# Patient Record
Sex: Female | Born: 1950 | Race: White | Hispanic: No | Marital: Married | Smoking: Never smoker
Health system: Southern US, Community
[De-identification: ages and names within clinical notes are randomized; demographics above are authoritative.]

## PROBLEM LIST (undated history)

## (undated) DIAGNOSIS — I10 Essential (primary) hypertension: Secondary | ICD-10-CM

## (undated) DIAGNOSIS — E7800 Pure hypercholesterolemia, unspecified: Secondary | ICD-10-CM

## (undated) HISTORY — PX: TONSILLECTOMY: SUR1361

## (undated) HISTORY — PX: ADENOIDECTOMY: SUR15

## (undated) HISTORY — PX: CHOLECYSTECTOMY: SHX55

## (undated) HISTORY — PX: APPENDECTOMY: SHX54

---

## 2015-07-30 ENCOUNTER — Ambulatory Visit (INDEPENDENT_AMBULATORY_CARE_PROVIDER_SITE_OTHER): Payer: No Typology Code available for payment source | Admitting: Family Medicine

## 2015-07-30 ENCOUNTER — Encounter: Payer: Self-pay | Admitting: Family Medicine

## 2015-07-30 ENCOUNTER — Encounter (INDEPENDENT_AMBULATORY_CARE_PROVIDER_SITE_OTHER): Payer: Self-pay

## 2015-07-30 VITALS — BP 138/88 | HR 81 | Ht 62.0 in | Wt 116.0 lb

## 2015-07-30 DIAGNOSIS — S6991XA Unspecified injury of right wrist, hand and finger(s), initial encounter: Secondary | ICD-10-CM | POA: Diagnosis not present

## 2015-07-30 DIAGNOSIS — I10 Essential (primary) hypertension: Secondary | ICD-10-CM

## 2015-07-30 DIAGNOSIS — E785 Hyperlipidemia, unspecified: Secondary | ICD-10-CM | POA: Diagnosis not present

## 2015-07-30 DIAGNOSIS — M8580 Other specified disorders of bone density and structure, unspecified site: Secondary | ICD-10-CM | POA: Diagnosis not present

## 2015-07-30 NOTE — Patient Instructions (Signed)
Your exam currently is consistent with an extensor tendon strain (partial tear also possible) of extensor tendon of your right middle finger. I would avoid splint at this point as you've completed 6 weeks of this. Start occupational therapy - discuss at work or with your neighbor and do this daily. Icing or heat as needed 15 minutes at a time (whichever feels better). Ibuprofen or aleve as needed for pain. Would consider an MRI if not improving as expected at follow-up in 6 weeks.

## 2015-07-31 DIAGNOSIS — S6991XA Unspecified injury of right wrist, hand and finger(s), initial encounter: Secondary | ICD-10-CM | POA: Insufficient documentation

## 2015-07-31 DIAGNOSIS — M8580 Other specified disorders of bone density and structure, unspecified site: Secondary | ICD-10-CM | POA: Insufficient documentation

## 2015-07-31 DIAGNOSIS — E785 Hyperlipidemia, unspecified: Secondary | ICD-10-CM | POA: Insufficient documentation

## 2015-07-31 DIAGNOSIS — I10 Essential (primary) hypertension: Secondary | ICD-10-CM | POA: Insufficient documentation

## 2015-07-31 NOTE — Assessment & Plan Note (Signed)
Initial radiographs negative - I independently reviewed these as well.  She is about 9 weeks out from the initial injury.  No malrotation or angulation and the ultrasound is reassuring she does not have a nonunion (would expect other fractures to have healed by this point).  No boutonniere deformity, ultrasound shows extensor tendon without complete tear, and has already done 6 weeks of extension splinting.  Based on location of pain, exam I suspect this is an extensor tendon strain only.  Advised we go ahead with occupational therapy, icing/heat, nsaids.  Consider MRI if still not improving as expected after 6 weeks.

## 2015-07-31 NOTE — Progress Notes (Signed)
PCP: No primary care provider on file.  Subjective:   HPI: Patient is a 64 y.o. female here for right 3rd digit injury.  Patient reports she was using a drill on sheet metal when the bit was caught and caused torque to her hand on 9/3. Drill then fell directly on her 3rd digit on radial aspect. + swelling, bruising, difficulty moving this especially about PIP area. Pain has persisted at 4/10 level. Radiographs were negative for fracture. Pain sharp. Used an extension splint for at least 6 weeks. Finger gets cold. No skin changes, fever, other complaints.  No past medical history on file.  No current outpatient prescriptions on file prior to visit.   No current facility-administered medications on file prior to visit.    No past surgical history on file.  No Known Allergies  Social History   Social History  . Marital Status: Married    Spouse Name: N/A  . Number of Children: N/A  . Years of Education: N/A   Occupational History  . Not on file.   Social History Main Topics  . Smoking status: Never Smoker   . Smokeless tobacco: Not on file  . Alcohol Use: Not on file  . Drug Use: Not on file  . Sexual Activity: Not on file   Other Topics Concern  . Not on file   Social History Narrative  . No narrative on file    No family history on file.  BP 138/88 mmHg  Pulse 81  Ht 5\' 2"  (1.575 m)  Wt 116 lb (52.617 kg)  BMI 21.21 kg/m2  Review of Systems: See HPI above.    Objective:  Physical Exam:  Gen: NAD  Right 3rd digit: Mild swelling throughout digit - greatest around PIP.  No bruising, other deformity.  No malrotation or angulation. TTP circumferentially about 3rd PIP but worst dorsally here.  No other tenderness. Able to resist flexion and extension at DIP, PIP, MCP. Collateral ligaments intact. NVI distally.  Left 3rd digit: FROM without pain or weakness.  MSK u/s right 3rd digit: Extensor tendons appear intact.  No cortical irregularity to  suggest a nonunion fracture.    Assessment & Plan:  1. Right 3rd digit injury - Initial radiographs negative - I independently reviewed these as well.  She is about 9 weeks out from the initial injury.  No malrotation or angulation and the ultrasound is reassuring she does not have a nonunion (would expect other fractures to have healed by this point).  No boutonniere deformity, ultrasound shows extensor tendon without complete tear, and has already done 6 weeks of extension splinting.  Based on location of pain, exam I suspect this is an extensor tendon strain only.  Advised we go ahead with occupational therapy, icing/heat, nsaids.  Consider MRI if still not improving as expected after 6 weeks.

## 2015-09-09 ENCOUNTER — Ambulatory Visit: Payer: No Typology Code available for payment source | Admitting: Family Medicine

## 2015-09-09 ENCOUNTER — Ambulatory Visit (INDEPENDENT_AMBULATORY_CARE_PROVIDER_SITE_OTHER): Payer: No Typology Code available for payment source | Admitting: Family Medicine

## 2015-09-09 ENCOUNTER — Encounter: Payer: Self-pay | Admitting: Family Medicine

## 2015-09-09 VITALS — BP 134/84 | HR 99 | Ht 62.0 in | Wt 119.0 lb

## 2015-09-09 DIAGNOSIS — S6991XA Unspecified injury of right wrist, hand and finger(s), initial encounter: Secondary | ICD-10-CM | POA: Diagnosis not present

## 2015-09-11 ENCOUNTER — Ambulatory Visit
Admission: RE | Admit: 2015-09-11 | Discharge: 2015-09-11 | Disposition: A | Discharge status: Home / Self Care | Payer: No Typology Code available for payment source | Source: Ambulatory Visit | Attending: Family Medicine | Admitting: Family Medicine

## 2015-09-11 DIAGNOSIS — S6991XA Unspecified injury of right wrist, hand and finger(s), initial encounter: Secondary | ICD-10-CM

## 2015-09-16 NOTE — Assessment & Plan Note (Signed)
Right 3rd digit injury - Initial radiographs negative - I independently reviewed these as well.  She has done occupational therapy, home exercises, extension splinting.  MSK u/s reassuring as well.  Suspect scar tissue formation is preventing full motion though a loose body, OCD is possible.  We first saw her 9 weeks out so is possible she had a hairline fracture that fully healed as well and just has post-injury deformity.  Discussed options and will go ahead with MRI to further assess.

## 2015-09-16 NOTE — Progress Notes (Addendum)
PCP: Paulina FusiSCHULTZ,DOUGLAS E, MD  Subjective:   HPI: Patient is a 64 y.o. female here for right 3rd digit injury.  11/10: Patient reports she was using a drill on sheet metal when the bit was caught and caused torque to her hand on 9/3. Drill then fell directly on her 3rd digit on radial aspect. + swelling, bruising, difficulty moving this especially about PIP area. Pain has persisted at 4/10 level. Radiographs were negative for fracture. Pain sharp. Used an extension splint for at least 6 weeks. Finger gets cold. No skin changes, fever, other complaints.  12/21: Patient reports she has improved since last visit. Pain level 0/10 though can be 9/10 if she strikes finger on something, sharp. Feels like it turns blue if it gets cold and achy. Pain is primarily around 3rd PIP joint. Doing occupational therapy. No skin changes, fever. Hard to fully flex or extend this finger.  No past medical history on file.  Current Outpatient Prescriptions on File Prior to Visit  Medication Sig Dispense Refill  . alendronate (FOSAMAX) 70 MG tablet     . lisinopril (PRINIVIL,ZESTRIL) 10 MG tablet     . pravastatin (PRAVACHOL) 20 MG tablet      No current facility-administered medications on file prior to visit.    No past surgical history on file.  No Known Allergies  Social History   Social History  . Marital Status: Married    Spouse Name: N/A  . Number of Children: N/A  . Years of Education: N/A   Occupational History  . Not on file.   Social History Main Topics  . Smoking status: Never Smoker   . Smokeless tobacco: Not on file  . Alcohol Use: Not on file  . Drug Use: Not on file  . Sexual Activity: Not on file   Other Topics Concern  . Not on file   Social History Narrative    No family history on file.  BP 134/84 mmHg  Pulse 99  Ht 5\' 2"  (1.575 m)  Wt 119 lb (53.978 kg)  BMI 21.76 kg/m2  Review of Systems: See HPI above.    Objective:  Physical Exam:  Gen:  NAD  Right 3rd digit: Mild swelling throughout digit - greatest around PIP.  No bruising, other deformity.  No malrotation or angulation. Minimal TTP circumferentially about 3rd PIP.  No other tenderness. Flexion to 70 degrees, extension lacks 5 degrees. Able to resist flexion and extension at DIP, PIP, MCP with 5/5 strength. Collateral ligaments intact. NVI distally.  Left 3rd digit: FROM without pain or weakness.    Assessment & Plan:  1. Right 3rd digit injury - Initial radiographs negative - I independently reviewed these as well.  She has done occupational therapy, home exercises, extension splinting.  MSK u/s reassuring as well.  Suspect scar tissue formation is preventing full motion though a loose body, OCD is possible.  We first saw her 9 weeks out so is possible she had a hairline fracture that fully healed as well and just has post-injury deformity.  Discussed options and will go ahead with MRI to further assess.    Addendum:  MRI reviewed and discussed with patient - no evidence loose body, OCD, ligament or tendon tears.  Consistent with scar tissue - she will continue with occupational therapy, likely be more aggressive with this having normal MRI.

## 2016-03-08 DIAGNOSIS — I10 Essential (primary) hypertension: Secondary | ICD-10-CM | POA: Diagnosis not present

## 2016-03-08 DIAGNOSIS — E559 Vitamin D deficiency, unspecified: Secondary | ICD-10-CM | POA: Diagnosis not present

## 2016-03-08 DIAGNOSIS — E785 Hyperlipidemia, unspecified: Secondary | ICD-10-CM | POA: Diagnosis not present

## 2016-03-08 DIAGNOSIS — M8580 Other specified disorders of bone density and structure, unspecified site: Secondary | ICD-10-CM | POA: Diagnosis not present

## 2016-03-08 DIAGNOSIS — Z6822 Body mass index (BMI) 22.0-22.9, adult: Secondary | ICD-10-CM | POA: Diagnosis not present

## 2016-04-13 DIAGNOSIS — N3289 Other specified disorders of bladder: Secondary | ICD-10-CM | POA: Diagnosis not present

## 2016-04-13 DIAGNOSIS — N390 Urinary tract infection, site not specified: Secondary | ICD-10-CM | POA: Diagnosis not present

## 2016-04-13 DIAGNOSIS — Z6822 Body mass index (BMI) 22.0-22.9, adult: Secondary | ICD-10-CM | POA: Diagnosis not present

## 2016-09-09 DIAGNOSIS — Z1231 Encounter for screening mammogram for malignant neoplasm of breast: Secondary | ICD-10-CM | POA: Diagnosis not present

## 2016-09-09 DIAGNOSIS — I10 Essential (primary) hypertension: Secondary | ICD-10-CM | POA: Diagnosis not present

## 2016-09-09 DIAGNOSIS — Z9181 History of falling: Secondary | ICD-10-CM | POA: Diagnosis not present

## 2016-09-09 DIAGNOSIS — Z1389 Encounter for screening for other disorder: Secondary | ICD-10-CM | POA: Diagnosis not present

## 2016-09-09 DIAGNOSIS — M8589 Other specified disorders of bone density and structure, multiple sites: Secondary | ICD-10-CM | POA: Diagnosis not present

## 2016-09-09 DIAGNOSIS — E785 Hyperlipidemia, unspecified: Secondary | ICD-10-CM | POA: Diagnosis not present

## 2016-09-09 DIAGNOSIS — E559 Vitamin D deficiency, unspecified: Secondary | ICD-10-CM | POA: Diagnosis not present

## 2016-09-20 DIAGNOSIS — Z1231 Encounter for screening mammogram for malignant neoplasm of breast: Secondary | ICD-10-CM | POA: Diagnosis not present

## 2016-09-20 DIAGNOSIS — Z90710 Acquired absence of both cervix and uterus: Secondary | ICD-10-CM | POA: Diagnosis not present

## 2016-09-20 DIAGNOSIS — Z01419 Encounter for gynecological examination (general) (routine) without abnormal findings: Secondary | ICD-10-CM | POA: Diagnosis not present

## 2016-09-28 DIAGNOSIS — Z1231 Encounter for screening mammogram for malignant neoplasm of breast: Secondary | ICD-10-CM | POA: Diagnosis not present

## 2016-10-21 DIAGNOSIS — H524 Presbyopia: Secondary | ICD-10-CM | POA: Diagnosis not present

## 2016-10-21 DIAGNOSIS — H52222 Regular astigmatism, left eye: Secondary | ICD-10-CM | POA: Diagnosis not present

## 2016-10-21 DIAGNOSIS — H25813 Combined forms of age-related cataract, bilateral: Secondary | ICD-10-CM | POA: Diagnosis not present

## 2016-10-21 DIAGNOSIS — H5203 Hypermetropia, bilateral: Secondary | ICD-10-CM | POA: Diagnosis not present

## 2017-01-20 DIAGNOSIS — S8265XA Nondisplaced fracture of lateral malleolus of left fibula, initial encounter for closed fracture: Secondary | ICD-10-CM | POA: Diagnosis not present

## 2017-01-23 DIAGNOSIS — S82402A Unspecified fracture of shaft of left fibula, initial encounter for closed fracture: Secondary | ICD-10-CM | POA: Diagnosis not present

## 2017-02-16 DIAGNOSIS — S82402A Unspecified fracture of shaft of left fibula, initial encounter for closed fracture: Secondary | ICD-10-CM | POA: Diagnosis not present

## 2017-03-23 DIAGNOSIS — I10 Essential (primary) hypertension: Secondary | ICD-10-CM | POA: Diagnosis not present

## 2017-03-23 DIAGNOSIS — E785 Hyperlipidemia, unspecified: Secondary | ICD-10-CM | POA: Diagnosis not present

## 2017-03-23 DIAGNOSIS — Z6822 Body mass index (BMI) 22.0-22.9, adult: Secondary | ICD-10-CM | POA: Diagnosis not present

## 2017-03-23 DIAGNOSIS — M8589 Other specified disorders of bone density and structure, multiple sites: Secondary | ICD-10-CM | POA: Diagnosis not present

## 2017-03-23 DIAGNOSIS — E559 Vitamin D deficiency, unspecified: Secondary | ICD-10-CM | POA: Diagnosis not present

## 2017-03-23 DIAGNOSIS — Z139 Encounter for screening, unspecified: Secondary | ICD-10-CM | POA: Diagnosis not present

## 2017-03-30 DIAGNOSIS — S82402A Unspecified fracture of shaft of left fibula, initial encounter for closed fracture: Secondary | ICD-10-CM | POA: Diagnosis not present

## 2017-05-11 DIAGNOSIS — S82402A Unspecified fracture of shaft of left fibula, initial encounter for closed fracture: Secondary | ICD-10-CM | POA: Diagnosis not present

## 2017-05-18 DIAGNOSIS — Z1389 Encounter for screening for other disorder: Secondary | ICD-10-CM | POA: Diagnosis not present

## 2017-05-18 DIAGNOSIS — E785 Hyperlipidemia, unspecified: Secondary | ICD-10-CM | POA: Diagnosis not present

## 2017-05-18 DIAGNOSIS — Z0000 Encounter for general adult medical examination without abnormal findings: Secondary | ICD-10-CM | POA: Diagnosis not present

## 2017-05-18 DIAGNOSIS — Z139 Encounter for screening, unspecified: Secondary | ICD-10-CM | POA: Diagnosis not present

## 2017-05-18 DIAGNOSIS — Z9181 History of falling: Secondary | ICD-10-CM | POA: Diagnosis not present

## 2017-05-18 DIAGNOSIS — Z136 Encounter for screening for cardiovascular disorders: Secondary | ICD-10-CM | POA: Diagnosis not present

## 2017-09-26 DIAGNOSIS — E785 Hyperlipidemia, unspecified: Secondary | ICD-10-CM | POA: Diagnosis not present

## 2017-09-26 DIAGNOSIS — E559 Vitamin D deficiency, unspecified: Secondary | ICD-10-CM | POA: Diagnosis not present

## 2017-10-18 DIAGNOSIS — M8589 Other specified disorders of bone density and structure, multiple sites: Secondary | ICD-10-CM | POA: Diagnosis not present

## 2017-11-02 DIAGNOSIS — Z1231 Encounter for screening mammogram for malignant neoplasm of breast: Secondary | ICD-10-CM | POA: Diagnosis not present

## 2017-12-11 DIAGNOSIS — H25813 Combined forms of age-related cataract, bilateral: Secondary | ICD-10-CM | POA: Diagnosis not present

## 2017-12-11 DIAGNOSIS — H43811 Vitreous degeneration, right eye: Secondary | ICD-10-CM | POA: Diagnosis not present

## 2018-01-08 DIAGNOSIS — H43811 Vitreous degeneration, right eye: Secondary | ICD-10-CM | POA: Diagnosis not present

## 2018-03-27 DIAGNOSIS — E559 Vitamin D deficiency, unspecified: Secondary | ICD-10-CM | POA: Diagnosis not present

## 2018-03-27 DIAGNOSIS — M8589 Other specified disorders of bone density and structure, multiple sites: Secondary | ICD-10-CM | POA: Diagnosis not present

## 2018-03-27 DIAGNOSIS — E785 Hyperlipidemia, unspecified: Secondary | ICD-10-CM | POA: Diagnosis not present

## 2018-03-27 DIAGNOSIS — Z1339 Encounter for screening examination for other mental health and behavioral disorders: Secondary | ICD-10-CM | POA: Diagnosis not present

## 2018-03-27 DIAGNOSIS — I10 Essential (primary) hypertension: Secondary | ICD-10-CM | POA: Diagnosis not present

## 2018-06-13 DIAGNOSIS — Z139 Encounter for screening, unspecified: Secondary | ICD-10-CM | POA: Diagnosis not present

## 2018-06-13 DIAGNOSIS — Z1331 Encounter for screening for depression: Secondary | ICD-10-CM | POA: Diagnosis not present

## 2018-06-13 DIAGNOSIS — Z136 Encounter for screening for cardiovascular disorders: Secondary | ICD-10-CM | POA: Diagnosis not present

## 2018-06-13 DIAGNOSIS — Z1239 Encounter for other screening for malignant neoplasm of breast: Secondary | ICD-10-CM | POA: Diagnosis not present

## 2018-06-13 DIAGNOSIS — E785 Hyperlipidemia, unspecified: Secondary | ICD-10-CM | POA: Diagnosis not present

## 2018-06-13 DIAGNOSIS — Z9181 History of falling: Secondary | ICD-10-CM | POA: Diagnosis not present

## 2018-06-13 DIAGNOSIS — Z0000 Encounter for general adult medical examination without abnormal findings: Secondary | ICD-10-CM | POA: Diagnosis not present

## 2018-10-02 DIAGNOSIS — Z2821 Immunization not carried out because of patient refusal: Secondary | ICD-10-CM | POA: Diagnosis not present

## 2018-10-02 DIAGNOSIS — I10 Essential (primary) hypertension: Secondary | ICD-10-CM | POA: Diagnosis not present

## 2018-10-02 DIAGNOSIS — E559 Vitamin D deficiency, unspecified: Secondary | ICD-10-CM | POA: Diagnosis not present

## 2018-10-02 DIAGNOSIS — E785 Hyperlipidemia, unspecified: Secondary | ICD-10-CM | POA: Diagnosis not present

## 2018-10-02 DIAGNOSIS — Z23 Encounter for immunization: Secondary | ICD-10-CM | POA: Diagnosis not present

## 2018-10-02 DIAGNOSIS — M8589 Other specified disorders of bone density and structure, multiple sites: Secondary | ICD-10-CM | POA: Diagnosis not present

## 2018-10-02 DIAGNOSIS — Z1231 Encounter for screening mammogram for malignant neoplasm of breast: Secondary | ICD-10-CM | POA: Diagnosis not present

## 2018-11-13 DIAGNOSIS — Z1231 Encounter for screening mammogram for malignant neoplasm of breast: Secondary | ICD-10-CM | POA: Diagnosis not present

## 2018-12-21 DIAGNOSIS — L237 Allergic contact dermatitis due to plants, except food: Secondary | ICD-10-CM | POA: Diagnosis not present

## 2018-12-21 DIAGNOSIS — R748 Abnormal levels of other serum enzymes: Secondary | ICD-10-CM | POA: Diagnosis not present

## 2019-02-18 ENCOUNTER — Encounter: Payer: Self-pay | Admitting: Gastroenterology

## 2019-03-08 DIAGNOSIS — H04123 Dry eye syndrome of bilateral lacrimal glands: Secondary | ICD-10-CM | POA: Diagnosis not present

## 2019-04-08 DIAGNOSIS — E559 Vitamin D deficiency, unspecified: Secondary | ICD-10-CM | POA: Diagnosis not present

## 2019-04-08 DIAGNOSIS — M8589 Other specified disorders of bone density and structure, multiple sites: Secondary | ICD-10-CM | POA: Diagnosis not present

## 2019-04-08 DIAGNOSIS — K219 Gastro-esophageal reflux disease without esophagitis: Secondary | ICD-10-CM | POA: Diagnosis not present

## 2019-04-08 DIAGNOSIS — I10 Essential (primary) hypertension: Secondary | ICD-10-CM | POA: Diagnosis not present

## 2019-04-08 DIAGNOSIS — E785 Hyperlipidemia, unspecified: Secondary | ICD-10-CM | POA: Diagnosis not present

## 2019-06-18 DIAGNOSIS — Z9181 History of falling: Secondary | ICD-10-CM | POA: Diagnosis not present

## 2019-06-18 DIAGNOSIS — Z139 Encounter for screening, unspecified: Secondary | ICD-10-CM | POA: Diagnosis not present

## 2019-06-18 DIAGNOSIS — E785 Hyperlipidemia, unspecified: Secondary | ICD-10-CM | POA: Diagnosis not present

## 2019-06-18 DIAGNOSIS — Z1331 Encounter for screening for depression: Secondary | ICD-10-CM | POA: Diagnosis not present

## 2019-06-18 DIAGNOSIS — Z1211 Encounter for screening for malignant neoplasm of colon: Secondary | ICD-10-CM | POA: Diagnosis not present

## 2019-06-18 DIAGNOSIS — Z0000 Encounter for general adult medical examination without abnormal findings: Secondary | ICD-10-CM | POA: Diagnosis not present

## 2019-10-08 DIAGNOSIS — K219 Gastro-esophageal reflux disease without esophagitis: Secondary | ICD-10-CM | POA: Diagnosis not present

## 2019-10-08 DIAGNOSIS — E559 Vitamin D deficiency, unspecified: Secondary | ICD-10-CM | POA: Diagnosis not present

## 2019-10-08 DIAGNOSIS — E785 Hyperlipidemia, unspecified: Secondary | ICD-10-CM | POA: Diagnosis not present

## 2019-10-08 DIAGNOSIS — Z1231 Encounter for screening mammogram for malignant neoplasm of breast: Secondary | ICD-10-CM | POA: Diagnosis not present

## 2019-10-08 DIAGNOSIS — M8589 Other specified disorders of bone density and structure, multiple sites: Secondary | ICD-10-CM | POA: Diagnosis not present

## 2019-10-08 DIAGNOSIS — I10 Essential (primary) hypertension: Secondary | ICD-10-CM | POA: Diagnosis not present

## 2019-11-22 DIAGNOSIS — M8589 Other specified disorders of bone density and structure, multiple sites: Secondary | ICD-10-CM | POA: Diagnosis not present

## 2019-12-12 DIAGNOSIS — Z1231 Encounter for screening mammogram for malignant neoplasm of breast: Secondary | ICD-10-CM | POA: Diagnosis not present

## 2019-12-12 DIAGNOSIS — M8589 Other specified disorders of bone density and structure, multiple sites: Secondary | ICD-10-CM | POA: Diagnosis not present

## 2019-12-31 DIAGNOSIS — R319 Hematuria, unspecified: Secondary | ICD-10-CM | POA: Diagnosis not present

## 2020-01-08 DIAGNOSIS — R319 Hematuria, unspecified: Secondary | ICD-10-CM | POA: Diagnosis not present

## 2020-01-29 ENCOUNTER — Emergency Department (HOSPITAL_BASED_OUTPATIENT_CLINIC_OR_DEPARTMENT_OTHER): Payer: PPO

## 2020-01-29 ENCOUNTER — Emergency Department (HOSPITAL_BASED_OUTPATIENT_CLINIC_OR_DEPARTMENT_OTHER)
Admission: EM | Admit: 2020-01-29 | Discharge: 2020-01-29 | Disposition: A | Discharge status: Home / Self Care | Payer: PPO | Attending: Emergency Medicine | Admitting: Emergency Medicine

## 2020-01-29 ENCOUNTER — Other Ambulatory Visit: Payer: Self-pay

## 2020-01-29 ENCOUNTER — Encounter (HOSPITAL_BASED_OUTPATIENT_CLINIC_OR_DEPARTMENT_OTHER): Payer: Self-pay | Admitting: Emergency Medicine

## 2020-01-29 DIAGNOSIS — R55 Syncope and collapse: Secondary | ICD-10-CM | POA: Diagnosis not present

## 2020-01-29 DIAGNOSIS — R42 Dizziness and giddiness: Secondary | ICD-10-CM | POA: Insufficient documentation

## 2020-01-29 DIAGNOSIS — R0789 Other chest pain: Secondary | ICD-10-CM | POA: Insufficient documentation

## 2020-01-29 DIAGNOSIS — I10 Essential (primary) hypertension: Secondary | ICD-10-CM | POA: Insufficient documentation

## 2020-01-29 DIAGNOSIS — Z79899 Other long term (current) drug therapy: Secondary | ICD-10-CM | POA: Diagnosis not present

## 2020-01-29 DIAGNOSIS — E782 Mixed hyperlipidemia: Secondary | ICD-10-CM | POA: Diagnosis not present

## 2020-01-29 DIAGNOSIS — R079 Chest pain, unspecified: Secondary | ICD-10-CM

## 2020-01-29 DIAGNOSIS — R519 Headache, unspecified: Secondary | ICD-10-CM | POA: Diagnosis not present

## 2020-01-29 HISTORY — DX: Essential (primary) hypertension: I10

## 2020-01-29 HISTORY — DX: Pure hypercholesterolemia, unspecified: E78.00

## 2020-01-29 LAB — CBC WITH DIFFERENTIAL/PLATELET
Abs Immature Granulocytes: 0.04 10*3/uL (ref 0.00–0.07)
Basophils Absolute: 0 10*3/uL (ref 0.0–0.1)
Basophils Relative: 0 %
Eosinophils Absolute: 0 10*3/uL (ref 0.0–0.5)
Eosinophils Relative: 0 %
HCT: 43.1 % (ref 36.0–46.0)
Hemoglobin: 14.2 g/dL (ref 12.0–15.0)
Immature Granulocytes: 0 %
Lymphocytes Relative: 11 %
Lymphs Abs: 1.3 10*3/uL (ref 0.7–4.0)
MCH: 29.6 pg (ref 26.0–34.0)
MCHC: 32.9 g/dL (ref 30.0–36.0)
MCV: 89.8 fL (ref 80.0–100.0)
Monocytes Absolute: 0.6 10*3/uL (ref 0.1–1.0)
Monocytes Relative: 5 %
Neutro Abs: 9.4 10*3/uL — ABNORMAL HIGH (ref 1.7–7.7)
Neutrophils Relative %: 84 %
Platelets: 253 10*3/uL (ref 150–400)
RBC: 4.8 MIL/uL (ref 3.87–5.11)
RDW: 13.4 % (ref 11.5–15.5)
WBC: 11.4 10*3/uL — ABNORMAL HIGH (ref 4.0–10.5)
nRBC: 0 % (ref 0.0–0.2)

## 2020-01-29 LAB — COMPREHENSIVE METABOLIC PANEL
ALT: 16 U/L (ref 0–44)
AST: 19 U/L (ref 15–41)
Albumin: 4.1 g/dL (ref 3.5–5.0)
Alkaline Phosphatase: 80 U/L (ref 38–126)
Anion gap: 8 (ref 5–15)
BUN: 26 mg/dL — ABNORMAL HIGH (ref 8–23)
CO2: 25 mmol/L (ref 22–32)
Calcium: 9.8 mg/dL (ref 8.9–10.3)
Chloride: 107 mmol/L (ref 98–111)
Creatinine, Ser: 0.62 mg/dL (ref 0.44–1.00)
GFR calc Af Amer: >60 mL/min (ref >60)
GFR calc non Af Amer: >60 mL/min (ref >60)
Glucose, Bld: 127 mg/dL — ABNORMAL HIGH (ref 70–99)
Potassium: 4 mmol/L (ref 3.5–5.1)
Sodium: 140 mmol/L (ref 135–145)
Total Bilirubin: 0.4 mg/dL (ref 0.3–1.2)
Total Protein: 7.2 g/dL (ref 6.5–8.1)

## 2020-01-29 LAB — TROPONIN I (HIGH SENSITIVITY)
Troponin I (High Sensitivity): 3 ng/L (ref <18)
Troponin I (High Sensitivity): 3 ng/L (ref <18)

## 2020-01-29 MED ORDER — IOHEXOL 350 MG/ML SOLN
100.0000 mL | Freq: Once | INTRAVENOUS | Status: AC | PRN
  Administered 2020-01-29: 100 mL via INTRAVENOUS

## 2020-01-29 NOTE — ED Triage Notes (Signed)
Pt arrives with c/o syncope while cleaning a wound on family member's hand. Began to feel nauseated and passed out. Family states she hit her head.

## 2020-01-29 NOTE — ED Notes (Signed)
Pt returned from CT °

## 2020-01-29 NOTE — ED Provider Notes (Signed)
[facility] DEPART[facility] Provider Note   CSN: [id number] Arrival date & time: 01/29/20  1124     History Chief Complaint  Patient presents with  . Loss of Consciousness    Nichole Stephens is a 69 y.o. female.  HPI     Finished cleaning a wound, then felt nauseas,lightheaded and had episode of syncope Hit head on the wall EMS came and initially refused, then called PCP and came for evaluation Not on blood thinners, 81mg  ASA Probably a few seconds out Slight headache 3/10 Pain right chest, worse with movement and deep breath Feels tired [address] [place] on right side per family  No other recent illness, no chest pains/sob/headache prior to hitting head. Eating and drinking ok, no black or bloody stools. Did have hematuria checked out and it was better  Passed out before in restaurant, saw Cardiologist had ECHO, holter monitor, 20 yr ago  Past Medical History:  Diagnosis Date  . Hypercholesteremia   . Hypertension     Patient Active Problem List   Diagnosis Date Noted  . Essential hypertension, benign 07/31/2015  . Hyperlipidemia 07/31/2015  . Osteopenia 07/31/2015  . Injury of right middle finger 07/31/2015    Past Surgical History:  Procedure Laterality Date  . ADENOIDECTOMY    . APPENDECTOMY    . CHOLECYSTECTOMY    . TONSILLECTOMY       OB History   No obstetric history on file.     No family history on file.  Social History   Tobacco Use  . Smoking status: Never Smoker  . Smokeless tobacco: Never Used  Substance Use Topics  . Alcohol use: Not Currently    Alcohol/week: 0.0 standard drinks  . Drug use: Not Currently    Home Medications Prior to Admission medications   Medication Sig Start Date End Date Taking? Authorizing Provider  alendronate (FOSAMAX) 70 MG tablet  07/27/15  Yes [provider]  lisinopril (PRINIVIL,ZESTRIL) 10 MG tablet  06/18/15   [provider]  pravastatin (PRAVACHOL) 20 MG tablet  05/27/15    [provider]    Allergies    Patient has no known allergies.  Review of Systems   Review of Systems  Constitutional: Negative for fever.  HENT: Negative for congestion.   Eyes: Negative for visual disturbance.  Respiratory: Negative for cough and shortness of breath.   Cardiovascular: Positive for chest pain (right sided).  Gastrointestinal: Negative for abdominal pain, nausea and vomiting.  Musculoskeletal: Positive for back pain.  Neurological: Positive for syncope, light-headedness and headaches. Negative for dizziness, facial asymmetry, speech difficulty, weakness and numbness.    Physical Exam Updated Vital Signs BP (!) 168/74 (BP Location: Left Arm)   Pulse 68   Temp 97.8 F (36.6 C) (Oral)   Resp 12   Ht 5\' 2"  (1.575 m)   Wt 49.9 kg   SpO2 99%   BMI 20.12 kg/m   Physical Exam Vitals and nursing note reviewed.  Constitutional:      General: She is not in acute distress.    Appearance: Normal appearance. She is well-developed. She is not ill-appearing or diaphoretic.  HENT:     Head: Normocephalic and atraumatic.  Eyes:     General: No visual field deficit.    Extraocular Movements: Extraocular movements intact.     Conjunctiva/sclera: Conjunctivae normal.     Pupils: Pupils are equal, round, and reactive to light.  Cardiovascular:     Rate and Rhythm: Normal rate and regular rhythm.  Pulses: Normal pulses.     Heart sounds: Normal heart sounds. No murmur. No friction rub. No gallop.      Comments: Mild chest wall and thoracic back tenderness  Pulmonary:     Effort: Pulmonary effort is normal. No respiratory distress.     Breath sounds: Normal breath sounds. No wheezing or rales.  Abdominal:     General: There is no distension.     Palpations: Abdomen is soft.     Tenderness: There is no abdominal tenderness. There is no guarding.  Musculoskeletal:        General: No swelling or tenderness.     Cervical back: Normal range of motion. No  rigidity or tenderness.  Skin:    General: Skin is warm and dry.     Findings: No erythema or rash.  Neurological:     General: No focal deficit present.     Mental Status: She is alert and oriented to person, place, and time.     GCS: GCS eye subscore is 4. GCS verbal subscore is 5. GCS motor subscore is 6.     Cranial Nerves: No cranial nerve deficit, dysarthria or facial asymmetry.     Sensory: No sensory deficit.     Motor: No weakness or tremor.     Coordination: Coordination normal. Finger-Nose-Finger Test normal.     Gait: Gait normal.     ED Results / Procedures / Treatments   Labs (all labs ordered are listed, but only abnormal results are displayed) Labs Reviewed  CBC WITH DIFFERENTIAL/PLATELET - Abnormal; Notable for the following components:      Result Value   WBC 11.4 (*)    Neutro Abs 9.4 (*)    All other components within normal limits  COMPREHENSIVE METABOLIC PANEL - Abnormal; Notable for the following components:   Glucose, Bld 127 (*)    BUN 26 (*)    All other components within normal limits  TROPONIN I (HIGH SENSITIVITY)  TROPONIN I (HIGH SENSITIVITY)    EKG EKG Interpretation  Date/Time:  Wednesday Jan 29 2020 11:34:53 EDT Ventricular Rate:  71 PR Interval:    QRS Duration: 89 QT Interval:  409 QTC Calculation: 445 R Axis:   50 Text Interpretation: Sinus rhythm Baseline wander in lead(s) III V6 No previous ECGs available Confirmed by Alvira Monday ([id number]) on 01/29/2020 11:52:00 AM   Radiology CT Head Wo Contrast  Result Date: 01/29/2020 CLINICAL DATA:  Head trauma, headache, struck head on LEFT side, has a knot, syncope, hypertension EXAM: CT HEAD WITHOUT CONTRAST TECHNIQUE: Contiguous axial images were obtained from the base of the skull through the vertex without intravenous contrast. Sagittal and coronal MPR images reconstructed from axial data set. COMPARISON:  None FINDINGS: Brain: Generalized atrophy. Normal ventricular morphology. No  midline shift or mass effect. Small vessel chronic ischemic changes of deep cerebral white matter. No intracranial hemorrhage, mass lesion, evidence of acute infarction, or extra-axial fluid collection. Vascular: No hyperdense vessels Skull: Intact. Small RIGHT parietal scalp hematoma. Sinuses/Orbits: Minimal fluid LEFT sphenoid sinus. Remaining paranasal sinuses and mastoid air cells clear Other: N/A IMPRESSION: Atrophy with small vessel chronic ischemic changes of deep cerebral white matter. No acute intracranial abnormalities. Small RIGHT parietal scalp hematoma. Electronically Signed   By: Ulyses Southward M.D.   On: 01/29/2020 14:18   CT Angio Chest PE W and/or Wo Contrast  Result Date: 01/29/2020 CLINICAL DATA:  Pleuritic chest pain, hypertension EXAM: CT ANGIOGRAPHY CHEST WITH CONTRAST TECHNIQUE: Multidetector CT imaging of  the chest was performed using the standard protocol during bolus administration of intravenous contrast. Multiplanar CT image reconstructions and MIPs were obtained to evaluate the vascular anatomy. CONTRAST:  OMNIPAQUE IOHEXOL 350 MG/ML SOLN COMPARISON:  X-ray 04/13/2006 FINDINGS: Cardiovascular: Satisfactory opacification of the pulmonary arteries to the segmental level. No evidence of pulmonary embolism. Normal heart size. No pericardial effusion. Thoracic aorta is nonaneurysmal. Four vessel arch, an anatomic variant. Scattered atherosclerotic calcification of the thoracic aorta. Mediastinum/Nodes: No enlarged mediastinal, hilar, or axillary lymph nodes. Thyroid gland, trachea, and esophagus demonstrate no significant findings. Lungs/Pleura: Mild bibasilar subsegmental atelectasis. No focal airspace consolidation. No pleural effusion or pneumothorax. Upper Abdomen: No acute abnormality. 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 This finding was present on prior chest radiograph 03/24/2006. There are remote  healed posterior left 4, 5, and sixth rib fractures. No acute osseous abnormality. Review of the MIP images confirms the above findings. IMPRESSION: 1. Negative for pulmonary embolism. 2. Lungs are clear. 3. Aortic atherosclerosis.  (ICD10-I70.0). Electronically Signed   By: Duanne Guess D.O.   On: 01/29/2020 14:20    Procedures Procedures (including critical care time)  Medications Ordered in ED Medications  iohexol (OMNIPAQUE) 350 MG/ML injection 100 mL (100 mLs Intravenous Contrast Given 01/29/20 1356)    ED Course  I have reviewed the triage vital signs and the nursing notes.  Pertinent labs & imaging results that were available during my care of the patient were reviewed by me and considered in my medical decision making (see chart for details).    MDM Rules/Calculators/A&P                      Very pleasant 69yo female with history of htn, hypercholesterolemia, episode of prior syncope many years ago, presents with concern for syncope after cleaning a wound.  Reports pleuritic pain, PE study done given hx of pain and syncope, shows no evidence of PE.  Low suspicion for dissection, normal pulses bilateral upper and lower extremities.  Hit head, no sign of ICH on CT.  No other significant abnormality on labwork.  Given situation, preceding nausea, lightheadedness, feel most likely vasovagal incident and msk CP. Recommend follow up with PCP.  Patient discharged in stable condition with understanding of reasons to return.     Final Clinical Impression(s) / ED Diagnoses Final diagnoses:  Vasovagal syncope  Right-sided chest pain    Rx / DC Orders ED Discharge Orders    None       Alvira Monday, MD 01/29/20 2236

## 2020-01-29 NOTE — ED Notes (Signed)
PT on monitor 

## 2020-01-29 NOTE — ED Notes (Signed)
Ambulated to bathroom with slow steady gait.

## 2020-01-29 NOTE — ED Notes (Signed)
ED Provider at bedside. 

## 2020-02-04 DIAGNOSIS — I10 Essential (primary) hypertension: Secondary | ICD-10-CM | POA: Diagnosis not present

## 2020-02-18 DIAGNOSIS — I10 Essential (primary) hypertension: Secondary | ICD-10-CM | POA: Diagnosis not present

## 2020-02-18 DIAGNOSIS — M25561 Pain in right knee: Secondary | ICD-10-CM | POA: Diagnosis not present

## 2020-03-13 DIAGNOSIS — H524 Presbyopia: Secondary | ICD-10-CM | POA: Diagnosis not present

## 2020-03-18 DIAGNOSIS — M25562 Pain in left knee: Secondary | ICD-10-CM | POA: Diagnosis not present

## 2020-03-18 DIAGNOSIS — M25462 Effusion, left knee: Secondary | ICD-10-CM | POA: Diagnosis not present

## 2020-03-19 DIAGNOSIS — I10 Essential (primary) hypertension: Secondary | ICD-10-CM | POA: Diagnosis not present

## 2020-03-31 DIAGNOSIS — M23221 Derangement of posterior horn of medial meniscus due to old tear or injury, right knee: Secondary | ICD-10-CM | POA: Diagnosis not present

## 2020-03-31 DIAGNOSIS — M25561 Pain in right knee: Secondary | ICD-10-CM | POA: Diagnosis not present

## 2020-03-31 DIAGNOSIS — X58XXXA Exposure to other specified factors, initial encounter: Secondary | ICD-10-CM | POA: Diagnosis not present

## 2020-03-31 DIAGNOSIS — M1711 Unilateral primary osteoarthritis, right knee: Secondary | ICD-10-CM | POA: Diagnosis not present

## 2020-03-31 DIAGNOSIS — M7121 Synovial cyst of popliteal space [Baker], right knee: Secondary | ICD-10-CM | POA: Diagnosis not present

## 2020-03-31 DIAGNOSIS — M25461 Effusion, right knee: Secondary | ICD-10-CM | POA: Diagnosis not present

## 2020-03-31 DIAGNOSIS — S83241A Other tear of medial meniscus, current injury, right knee, initial encounter: Secondary | ICD-10-CM | POA: Diagnosis not present

## 2020-03-31 DIAGNOSIS — M84451A Pathological fracture, right femur, initial encounter for fracture: Secondary | ICD-10-CM | POA: Diagnosis not present

## 2020-04-06 DIAGNOSIS — M25461 Effusion, right knee: Secondary | ICD-10-CM | POA: Diagnosis not present

## 2020-04-06 DIAGNOSIS — M1711 Unilateral primary osteoarthritis, right knee: Secondary | ICD-10-CM | POA: Diagnosis not present

## 2020-04-06 DIAGNOSIS — M25561 Pain in right knee: Secondary | ICD-10-CM | POA: Diagnosis not present

## 2020-04-06 DIAGNOSIS — M23321 Other meniscus derangements, posterior horn of medial meniscus, right knee: Secondary | ICD-10-CM | POA: Diagnosis not present

## 2020-04-14 ENCOUNTER — Other Ambulatory Visit: Payer: Self-pay

## 2020-04-15 DIAGNOSIS — I10 Essential (primary) hypertension: Secondary | ICD-10-CM | POA: Diagnosis not present

## 2020-04-15 DIAGNOSIS — K219 Gastro-esophageal reflux disease without esophagitis: Secondary | ICD-10-CM | POA: Diagnosis not present

## 2020-04-15 DIAGNOSIS — M8589 Other specified disorders of bone density and structure, multiple sites: Secondary | ICD-10-CM | POA: Diagnosis not present

## 2020-04-15 DIAGNOSIS — E785 Hyperlipidemia, unspecified: Secondary | ICD-10-CM | POA: Diagnosis not present

## 2020-04-15 DIAGNOSIS — E559 Vitamin D deficiency, unspecified: Secondary | ICD-10-CM | POA: Diagnosis not present

## 2020-04-16 DIAGNOSIS — Z1159 Encounter for screening for other viral diseases: Secondary | ICD-10-CM | POA: Diagnosis not present

## 2020-04-16 DIAGNOSIS — Z1152 Encounter for screening for COVID-19: Secondary | ICD-10-CM | POA: Diagnosis not present

## 2020-04-21 DIAGNOSIS — E7800 Pure hypercholesterolemia, unspecified: Secondary | ICD-10-CM | POA: Diagnosis not present

## 2020-04-21 DIAGNOSIS — M23321 Other meniscus derangements, posterior horn of medial meniscus, right knee: Secondary | ICD-10-CM | POA: Diagnosis not present

## 2020-04-21 DIAGNOSIS — M25461 Effusion, right knee: Secondary | ICD-10-CM | POA: Diagnosis not present

## 2020-04-21 DIAGNOSIS — Z79899 Other long term (current) drug therapy: Secondary | ICD-10-CM | POA: Diagnosis not present

## 2020-04-21 DIAGNOSIS — M810 Age-related osteoporosis without current pathological fracture: Secondary | ICD-10-CM | POA: Diagnosis not present

## 2020-04-21 DIAGNOSIS — Z7982 Long term (current) use of aspirin: Secondary | ICD-10-CM | POA: Diagnosis not present

## 2020-04-21 DIAGNOSIS — M1711 Unilateral primary osteoarthritis, right knee: Secondary | ICD-10-CM | POA: Diagnosis not present

## 2020-04-21 DIAGNOSIS — K219 Gastro-esophageal reflux disease without esophagitis: Secondary | ICD-10-CM | POA: Diagnosis not present

## 2020-04-21 DIAGNOSIS — M222X1 Patellofemoral disorders, right knee: Secondary | ICD-10-CM | POA: Diagnosis not present

## 2020-04-21 DIAGNOSIS — I10 Essential (primary) hypertension: Secondary | ICD-10-CM | POA: Diagnosis not present

## 2020-04-24 DIAGNOSIS — M25661 Stiffness of right knee, not elsewhere classified: Secondary | ICD-10-CM | POA: Diagnosis not present

## 2020-04-24 DIAGNOSIS — R2689 Other abnormalities of gait and mobility: Secondary | ICD-10-CM | POA: Diagnosis not present

## 2020-04-24 DIAGNOSIS — M6281 Muscle weakness (generalized): Secondary | ICD-10-CM | POA: Diagnosis not present

## 2020-04-24 DIAGNOSIS — M25561 Pain in right knee: Secondary | ICD-10-CM | POA: Diagnosis not present

## 2020-04-28 DIAGNOSIS — M25661 Stiffness of right knee, not elsewhere classified: Secondary | ICD-10-CM | POA: Diagnosis not present

## 2020-04-28 DIAGNOSIS — R2689 Other abnormalities of gait and mobility: Secondary | ICD-10-CM | POA: Diagnosis not present

## 2020-04-28 DIAGNOSIS — M25561 Pain in right knee: Secondary | ICD-10-CM | POA: Diagnosis not present

## 2020-04-28 DIAGNOSIS — M6281 Muscle weakness (generalized): Secondary | ICD-10-CM | POA: Diagnosis not present

## 2020-05-01 DIAGNOSIS — M6281 Muscle weakness (generalized): Secondary | ICD-10-CM | POA: Diagnosis not present

## 2020-05-01 DIAGNOSIS — M25561 Pain in right knee: Secondary | ICD-10-CM | POA: Diagnosis not present

## 2020-05-01 DIAGNOSIS — M25661 Stiffness of right knee, not elsewhere classified: Secondary | ICD-10-CM | POA: Diagnosis not present

## 2020-05-01 DIAGNOSIS — R2689 Other abnormalities of gait and mobility: Secondary | ICD-10-CM | POA: Diagnosis not present

## 2020-05-04 DIAGNOSIS — R2689 Other abnormalities of gait and mobility: Secondary | ICD-10-CM | POA: Diagnosis not present

## 2020-05-04 DIAGNOSIS — M25661 Stiffness of right knee, not elsewhere classified: Secondary | ICD-10-CM | POA: Diagnosis not present

## 2020-05-04 DIAGNOSIS — M25561 Pain in right knee: Secondary | ICD-10-CM | POA: Diagnosis not present

## 2020-05-04 DIAGNOSIS — M6281 Muscle weakness (generalized): Secondary | ICD-10-CM | POA: Diagnosis not present

## 2020-05-08 DIAGNOSIS — M25661 Stiffness of right knee, not elsewhere classified: Secondary | ICD-10-CM | POA: Diagnosis not present

## 2020-05-08 DIAGNOSIS — M6281 Muscle weakness (generalized): Secondary | ICD-10-CM | POA: Diagnosis not present

## 2020-05-08 DIAGNOSIS — M25561 Pain in right knee: Secondary | ICD-10-CM | POA: Diagnosis not present

## 2020-05-08 DIAGNOSIS — R2689 Other abnormalities of gait and mobility: Secondary | ICD-10-CM | POA: Diagnosis not present

## 2020-05-12 DIAGNOSIS — M25561 Pain in right knee: Secondary | ICD-10-CM | POA: Diagnosis not present

## 2020-05-12 DIAGNOSIS — M25661 Stiffness of right knee, not elsewhere classified: Secondary | ICD-10-CM | POA: Diagnosis not present

## 2020-05-12 DIAGNOSIS — R2689 Other abnormalities of gait and mobility: Secondary | ICD-10-CM | POA: Diagnosis not present

## 2020-05-12 DIAGNOSIS — M6281 Muscle weakness (generalized): Secondary | ICD-10-CM | POA: Diagnosis not present

## 2020-05-15 DIAGNOSIS — M25561 Pain in right knee: Secondary | ICD-10-CM | POA: Diagnosis not present

## 2020-05-15 DIAGNOSIS — M6281 Muscle weakness (generalized): Secondary | ICD-10-CM | POA: Diagnosis not present

## 2020-05-15 DIAGNOSIS — M25661 Stiffness of right knee, not elsewhere classified: Secondary | ICD-10-CM | POA: Diagnosis not present

## 2020-05-15 DIAGNOSIS — R2689 Other abnormalities of gait and mobility: Secondary | ICD-10-CM | POA: Diagnosis not present

## 2020-05-19 DIAGNOSIS — M6281 Muscle weakness (generalized): Secondary | ICD-10-CM | POA: Diagnosis not present

## 2020-05-19 DIAGNOSIS — R2689 Other abnormalities of gait and mobility: Secondary | ICD-10-CM | POA: Diagnosis not present

## 2020-05-19 DIAGNOSIS — M25561 Pain in right knee: Secondary | ICD-10-CM | POA: Diagnosis not present

## 2020-05-19 DIAGNOSIS — M25661 Stiffness of right knee, not elsewhere classified: Secondary | ICD-10-CM | POA: Diagnosis not present

## 2020-05-22 DIAGNOSIS — M6281 Muscle weakness (generalized): Secondary | ICD-10-CM | POA: Diagnosis not present

## 2020-05-22 DIAGNOSIS — R2689 Other abnormalities of gait and mobility: Secondary | ICD-10-CM | POA: Diagnosis not present

## 2020-05-22 DIAGNOSIS — M25561 Pain in right knee: Secondary | ICD-10-CM | POA: Diagnosis not present

## 2020-05-22 DIAGNOSIS — M25661 Stiffness of right knee, not elsewhere classified: Secondary | ICD-10-CM | POA: Diagnosis not present

## 2020-05-26 DIAGNOSIS — M6281 Muscle weakness (generalized): Secondary | ICD-10-CM | POA: Diagnosis not present

## 2020-05-26 DIAGNOSIS — M25661 Stiffness of right knee, not elsewhere classified: Secondary | ICD-10-CM | POA: Diagnosis not present

## 2020-05-26 DIAGNOSIS — M25561 Pain in right knee: Secondary | ICD-10-CM | POA: Diagnosis not present

## 2020-05-26 DIAGNOSIS — R2689 Other abnormalities of gait and mobility: Secondary | ICD-10-CM | POA: Diagnosis not present

## 2020-05-29 DIAGNOSIS — R2689 Other abnormalities of gait and mobility: Secondary | ICD-10-CM | POA: Diagnosis not present

## 2020-05-29 DIAGNOSIS — M25561 Pain in right knee: Secondary | ICD-10-CM | POA: Diagnosis not present

## 2020-05-29 DIAGNOSIS — M25661 Stiffness of right knee, not elsewhere classified: Secondary | ICD-10-CM | POA: Diagnosis not present

## 2020-05-29 DIAGNOSIS — M6281 Muscle weakness (generalized): Secondary | ICD-10-CM | POA: Diagnosis not present

## 2020-06-02 DIAGNOSIS — M6281 Muscle weakness (generalized): Secondary | ICD-10-CM | POA: Diagnosis not present

## 2020-06-02 DIAGNOSIS — M25561 Pain in right knee: Secondary | ICD-10-CM | POA: Diagnosis not present

## 2020-06-02 DIAGNOSIS — R2689 Other abnormalities of gait and mobility: Secondary | ICD-10-CM | POA: Diagnosis not present

## 2020-06-02 DIAGNOSIS — M25661 Stiffness of right knee, not elsewhere classified: Secondary | ICD-10-CM | POA: Diagnosis not present

## 2020-06-04 DIAGNOSIS — M25661 Stiffness of right knee, not elsewhere classified: Secondary | ICD-10-CM | POA: Diagnosis not present

## 2020-06-04 DIAGNOSIS — R2689 Other abnormalities of gait and mobility: Secondary | ICD-10-CM | POA: Diagnosis not present

## 2020-06-04 DIAGNOSIS — M25561 Pain in right knee: Secondary | ICD-10-CM | POA: Diagnosis not present

## 2020-06-04 DIAGNOSIS — M6281 Muscle weakness (generalized): Secondary | ICD-10-CM | POA: Diagnosis not present

## 2020-06-09 DIAGNOSIS — R2689 Other abnormalities of gait and mobility: Secondary | ICD-10-CM | POA: Diagnosis not present

## 2020-06-09 DIAGNOSIS — M25561 Pain in right knee: Secondary | ICD-10-CM | POA: Diagnosis not present

## 2020-06-09 DIAGNOSIS — M25661 Stiffness of right knee, not elsewhere classified: Secondary | ICD-10-CM | POA: Diagnosis not present

## 2020-06-09 DIAGNOSIS — M6281 Muscle weakness (generalized): Secondary | ICD-10-CM | POA: Diagnosis not present

## 2020-06-11 DIAGNOSIS — M25661 Stiffness of right knee, not elsewhere classified: Secondary | ICD-10-CM | POA: Diagnosis not present

## 2020-06-11 DIAGNOSIS — M6281 Muscle weakness (generalized): Secondary | ICD-10-CM | POA: Diagnosis not present

## 2020-06-11 DIAGNOSIS — R2689 Other abnormalities of gait and mobility: Secondary | ICD-10-CM | POA: Diagnosis not present

## 2020-06-11 DIAGNOSIS — M25561 Pain in right knee: Secondary | ICD-10-CM | POA: Diagnosis not present

## 2020-06-15 DIAGNOSIS — M6281 Muscle weakness (generalized): Secondary | ICD-10-CM | POA: Diagnosis not present

## 2020-06-15 DIAGNOSIS — R2689 Other abnormalities of gait and mobility: Secondary | ICD-10-CM | POA: Diagnosis not present

## 2020-06-15 DIAGNOSIS — M25561 Pain in right knee: Secondary | ICD-10-CM | POA: Diagnosis not present

## 2020-06-15 DIAGNOSIS — M25661 Stiffness of right knee, not elsewhere classified: Secondary | ICD-10-CM | POA: Diagnosis not present

## 2020-06-18 DIAGNOSIS — M25661 Stiffness of right knee, not elsewhere classified: Secondary | ICD-10-CM | POA: Diagnosis not present

## 2020-06-18 DIAGNOSIS — M25561 Pain in right knee: Secondary | ICD-10-CM | POA: Diagnosis not present

## 2020-06-18 DIAGNOSIS — R2689 Other abnormalities of gait and mobility: Secondary | ICD-10-CM | POA: Diagnosis not present

## 2020-06-18 DIAGNOSIS — M6281 Muscle weakness (generalized): Secondary | ICD-10-CM | POA: Diagnosis not present

## 2020-06-23 DIAGNOSIS — R2689 Other abnormalities of gait and mobility: Secondary | ICD-10-CM | POA: Diagnosis not present

## 2020-06-23 DIAGNOSIS — M25661 Stiffness of right knee, not elsewhere classified: Secondary | ICD-10-CM | POA: Diagnosis not present

## 2020-06-23 DIAGNOSIS — M6281 Muscle weakness (generalized): Secondary | ICD-10-CM | POA: Diagnosis not present

## 2020-06-23 DIAGNOSIS — M25561 Pain in right knee: Secondary | ICD-10-CM | POA: Diagnosis not present

## 2020-06-25 DIAGNOSIS — R2689 Other abnormalities of gait and mobility: Secondary | ICD-10-CM | POA: Diagnosis not present

## 2020-06-25 DIAGNOSIS — M25661 Stiffness of right knee, not elsewhere classified: Secondary | ICD-10-CM | POA: Diagnosis not present

## 2020-06-25 DIAGNOSIS — M25561 Pain in right knee: Secondary | ICD-10-CM | POA: Diagnosis not present

## 2020-06-25 DIAGNOSIS — M6281 Muscle weakness (generalized): Secondary | ICD-10-CM | POA: Diagnosis not present

## 2020-06-30 DIAGNOSIS — M25561 Pain in right knee: Secondary | ICD-10-CM | POA: Diagnosis not present

## 2020-06-30 DIAGNOSIS — M25661 Stiffness of right knee, not elsewhere classified: Secondary | ICD-10-CM | POA: Diagnosis not present

## 2020-06-30 DIAGNOSIS — M6281 Muscle weakness (generalized): Secondary | ICD-10-CM | POA: Diagnosis not present

## 2020-06-30 DIAGNOSIS — R2689 Other abnormalities of gait and mobility: Secondary | ICD-10-CM | POA: Diagnosis not present

## 2020-07-02 DIAGNOSIS — M6281 Muscle weakness (generalized): Secondary | ICD-10-CM | POA: Diagnosis not present

## 2020-07-02 DIAGNOSIS — M25561 Pain in right knee: Secondary | ICD-10-CM | POA: Diagnosis not present

## 2020-07-02 DIAGNOSIS — M25661 Stiffness of right knee, not elsewhere classified: Secondary | ICD-10-CM | POA: Diagnosis not present

## 2020-07-02 DIAGNOSIS — R2689 Other abnormalities of gait and mobility: Secondary | ICD-10-CM | POA: Diagnosis not present

## 2020-08-26 DIAGNOSIS — M23321 Other meniscus derangements, posterior horn of medial meniscus, right knee: Secondary | ICD-10-CM | POA: Diagnosis not present

## 2020-10-16 DIAGNOSIS — E785 Hyperlipidemia, unspecified: Secondary | ICD-10-CM | POA: Diagnosis not present

## 2020-10-16 DIAGNOSIS — Z1231 Encounter for screening mammogram for malignant neoplasm of breast: Secondary | ICD-10-CM | POA: Diagnosis not present

## 2020-10-16 DIAGNOSIS — I10 Essential (primary) hypertension: Secondary | ICD-10-CM | POA: Diagnosis not present

## 2020-10-16 DIAGNOSIS — K219 Gastro-esophageal reflux disease without esophagitis: Secondary | ICD-10-CM | POA: Diagnosis not present

## 2020-10-16 DIAGNOSIS — E559 Vitamin D deficiency, unspecified: Secondary | ICD-10-CM | POA: Diagnosis not present

## 2020-10-16 DIAGNOSIS — M8589 Other specified disorders of bone density and structure, multiple sites: Secondary | ICD-10-CM | POA: Diagnosis not present

## 2020-12-14 DIAGNOSIS — Z1231 Encounter for screening mammogram for malignant neoplasm of breast: Secondary | ICD-10-CM | POA: Diagnosis not present

## 2020-12-25 IMAGING — CT CT ANGIO CHEST
2 of 4 series · 19 of 36 positions shown · IV contrast (Omnipaque)
Comparison: X-ray 04/13/2006

CLINICAL DATA: Pleuritic chest pain, hypertension

EXAM:
CT ANGIOGRAPHY CHEST WITH CONTRAST
TECHNIQUE: Multidetector CT imaging of the chest was performed using the
standard protocol during bolus administration of intravenous
contrast. Multiplanar CT image reconstructions and MIPs were
obtained to evaluate the vascular anatomy.
CONTRAST:  100mL OMNIPAQUE IOHEXOL 350 MG/ML SOLN

[Series 4: pe axial st · axial · 0.59mm/px · z∈[+750,+972]mm · 18 of 80 slices shown]
[im 3/80  lung]
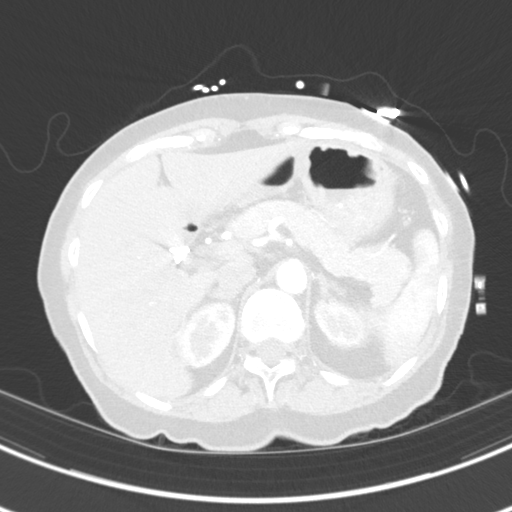
[im 9/80  mediastinal]
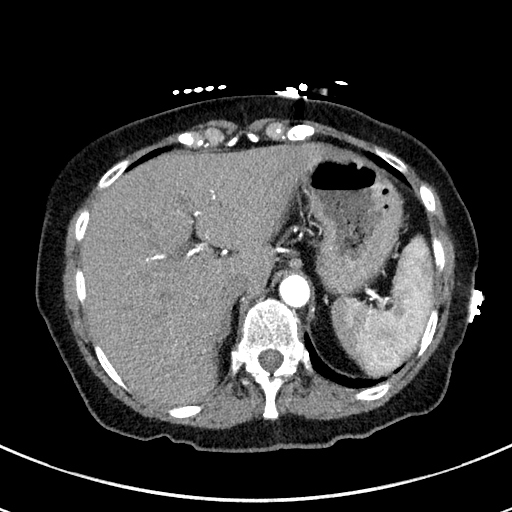
[im 12/80  lung]
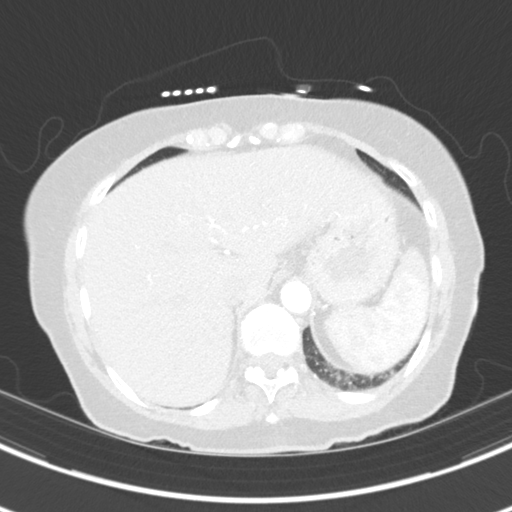
[im 18/80  mediastinal]
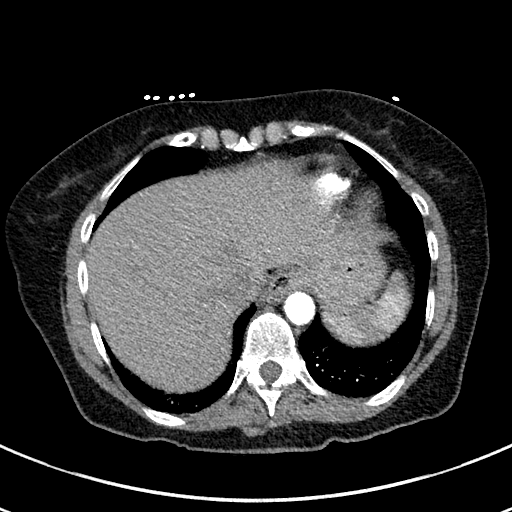
[im 21/80  lung]
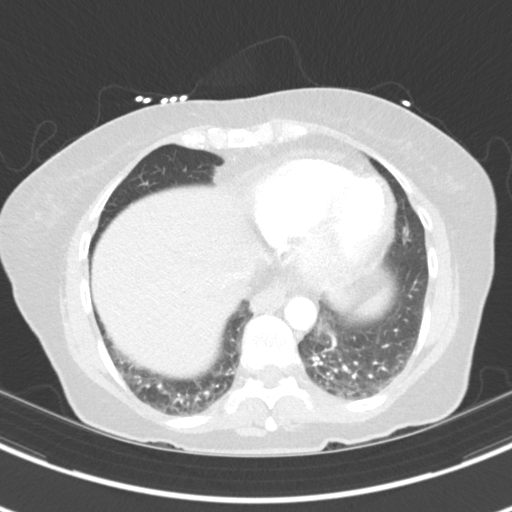
[im 24/80  mediastinal]
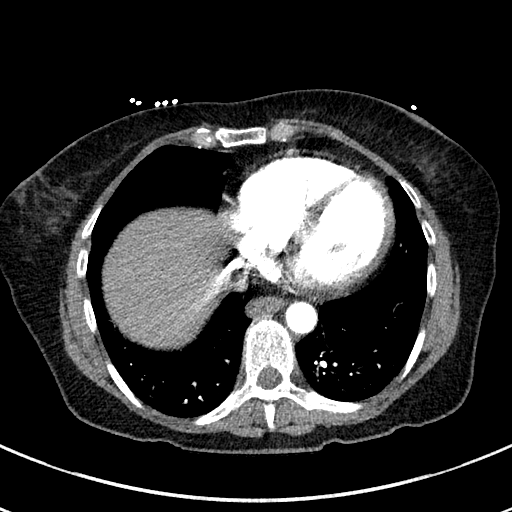
[im 30/80  lung]
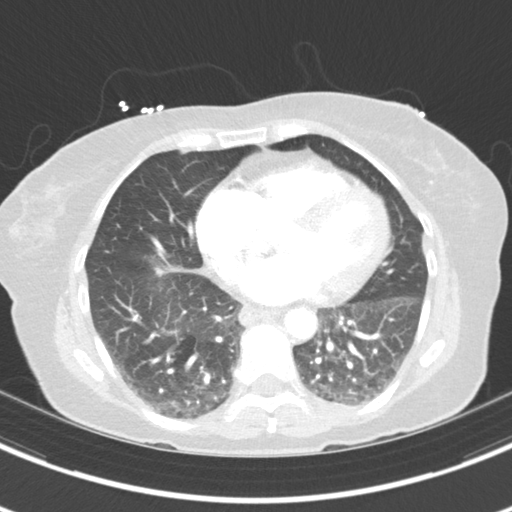
[im 33/80  mediastinal]
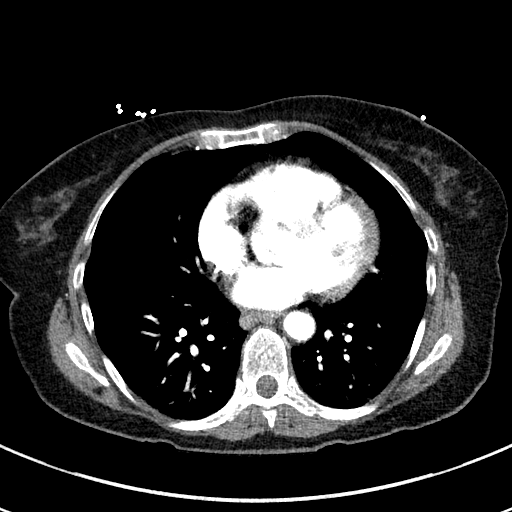
[im 39/80  lung]
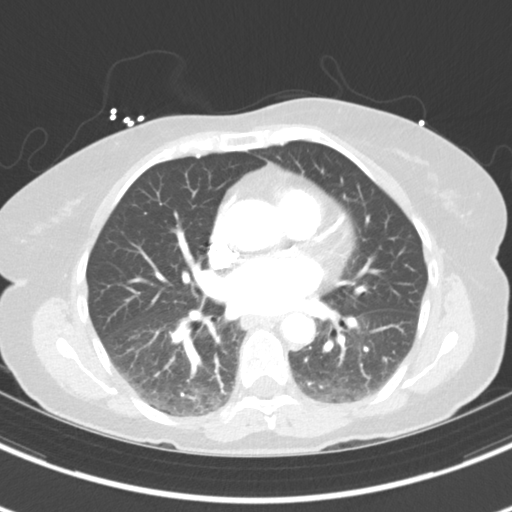
[im 41/80  mediastinal]
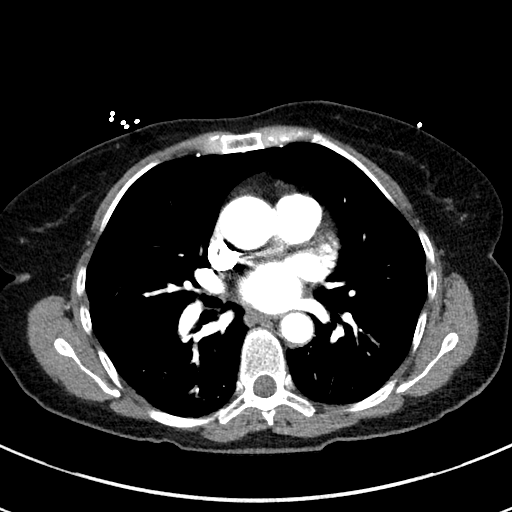
[im 47/80  lung]
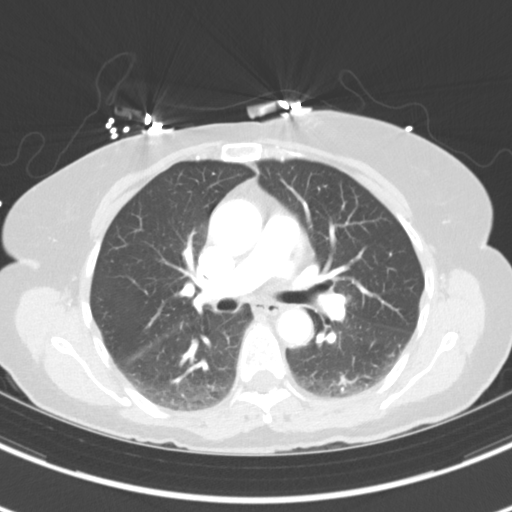
[im 50/80  mediastinal]
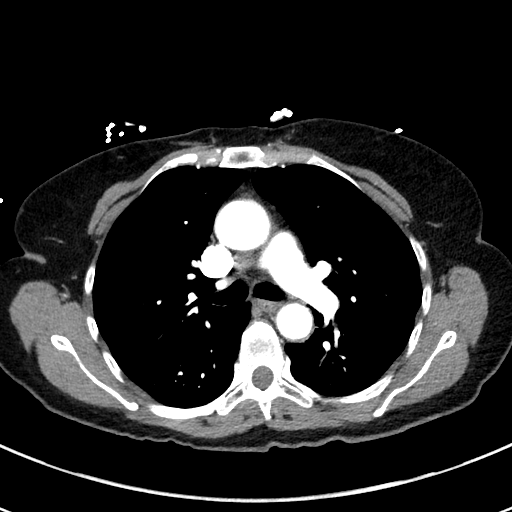
[im 56/80  lung]
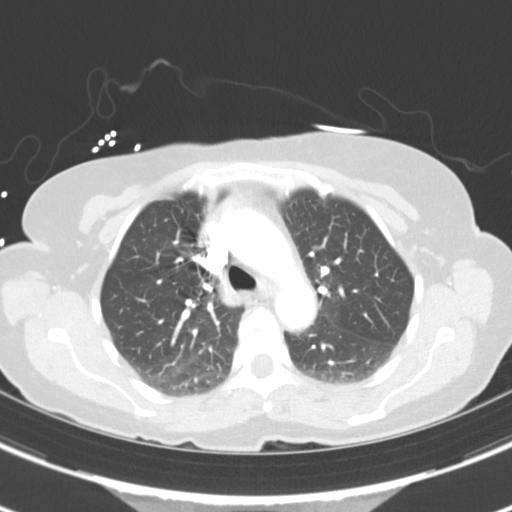
[im 59/80  mediastinal]
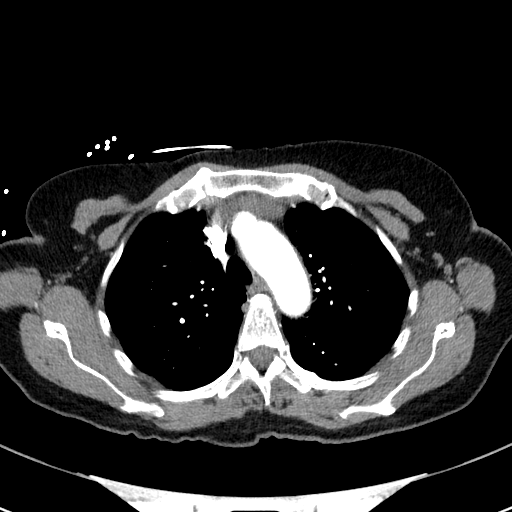
[im 62/80  lung]
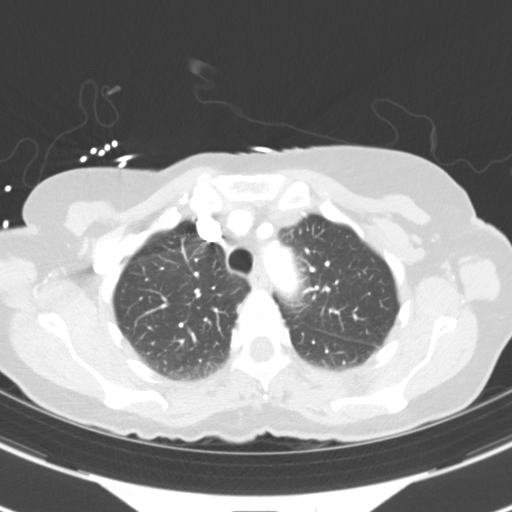
[im 68/80  mediastinal]
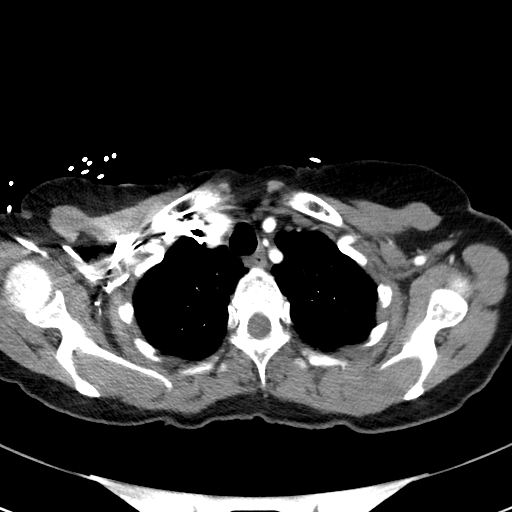
[im 71/80  lung]
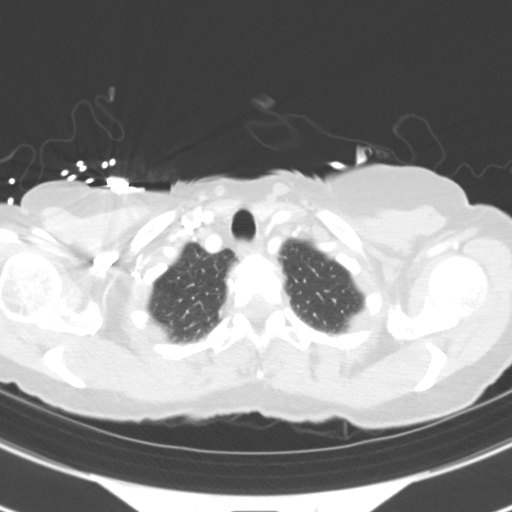
[im 77/80  mediastinal]
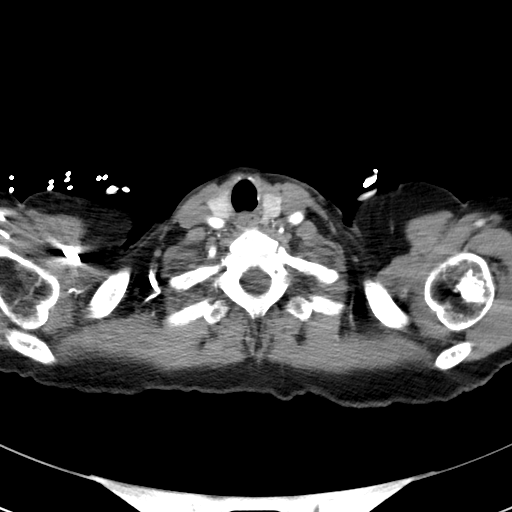

[Series 7: pe coronal mpr · coronal · 0.49mm/px · 1 of 108 slices shown]
[im 54/108  mediastinal]
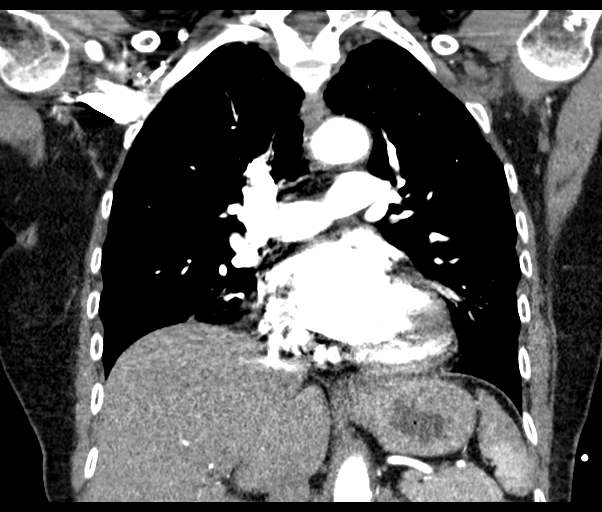

[19 of 36 positions shown; findings below may reference images not displayed]

FINDINGS: Cardiovascular: Satisfactory opacification of the pulmonary arteries
to the segmental level. No evidence of pulmonary embolism. Normal
heart size. No pericardial effusion. Thoracic aorta is
nonaneurysmal. Four vessel arch, an anatomic variant. Scattered
atherosclerotic calcification of the thoracic aorta.

Mediastinum/Nodes: No enlarged mediastinal, hilar, or axillary lymph
nodes. Thyroid gland, trachea, and esophagus demonstrate no
significant findings.

Lungs/Pleura: Mild bibasilar subsegmental atelectasis. No focal
airspace consolidation. No pleural effusion or pneumothorax.

Upper Abdomen: No acute abnormality.

Musculoskeletal: Partially visualized area of sclerosis in the
proximal left humeral metaphysis, likely enchondroma. This finding
was present on prior chest radiograph 03/24/2006. There are remote
healed posterior left 4, 5, and sixth rib fractures. No acute
osseous abnormality.

Review of the MIP images confirms the above findings.
IMPRESSION: 1. Negative for pulmonary embolism.
2. Lungs are clear.
3. Aortic atherosclerosis.  ([id number]).

## 2020-12-25 IMAGING — CT CT HEAD W/O CM
3 series · 15 of 47 positions shown, 18 images · non-contrast
Comparison: None

CLINICAL DATA: Head trauma, headache, struck head on LEFT side, has
a knot, syncope, hypertension

EXAM:
CT HEAD WITHOUT CONTRAST
TECHNIQUE: Contiguous axial images were obtained from the base of the skull
through the vertex without intravenous contrast. Sagittal and
coronal MPR images reconstructed from axial data set.

[Series 2: head wo · axial · 0.42mm/px · z∈[+1088,+1212]mm · 9 of 30 slices shown, 12 images]
[im 3/30  brain]
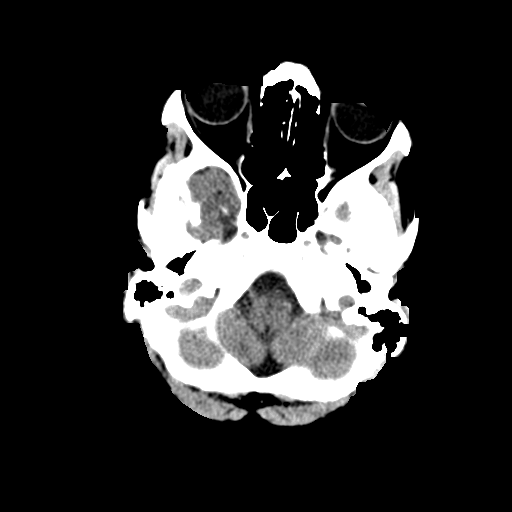
[im 3/30  bone]
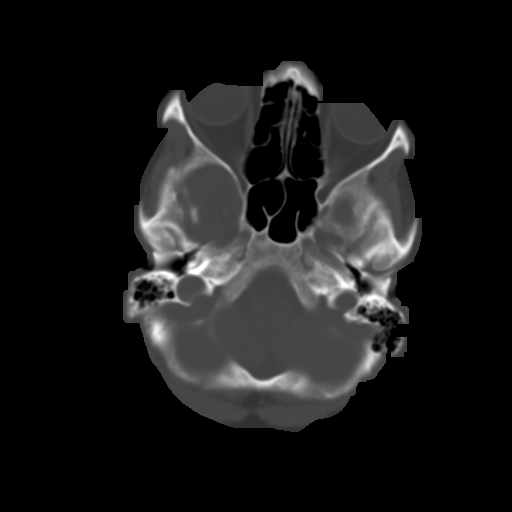
[im 6/30  brain]
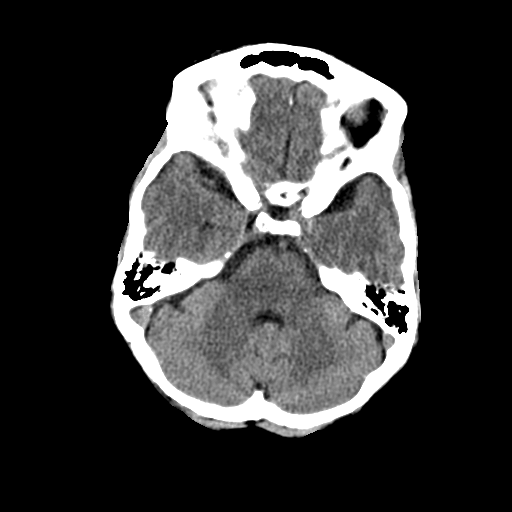
[im 9/30  brain]
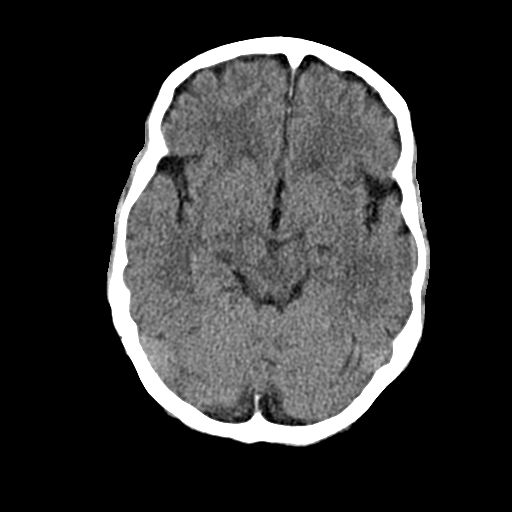
[im 12/30  brain]
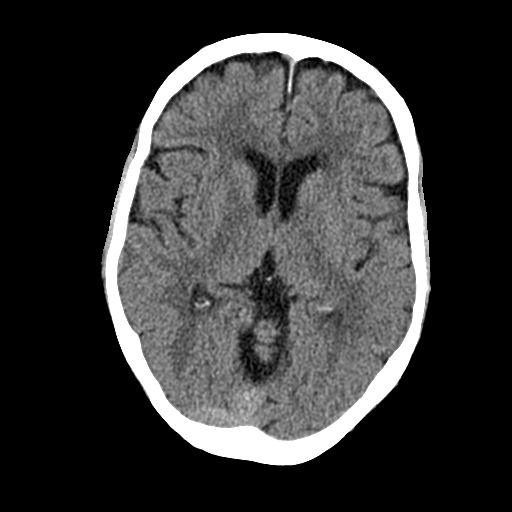
[im 16/30  brain]
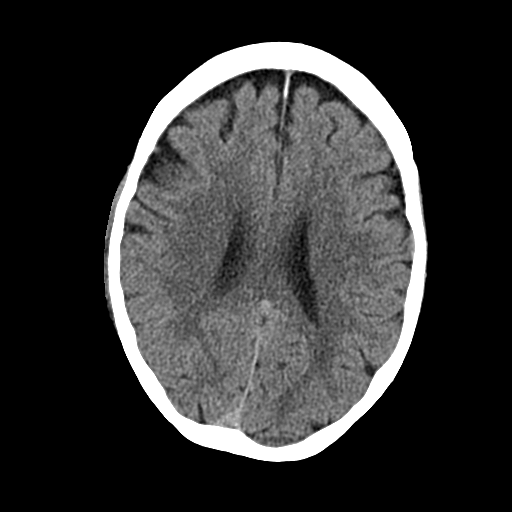
[im 16/30  bone]
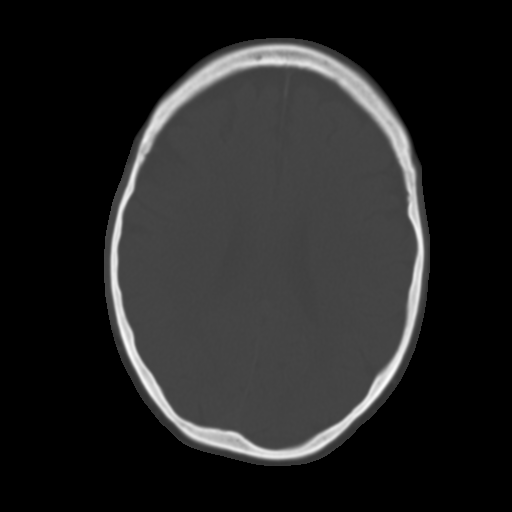
[im 19/30  brain]
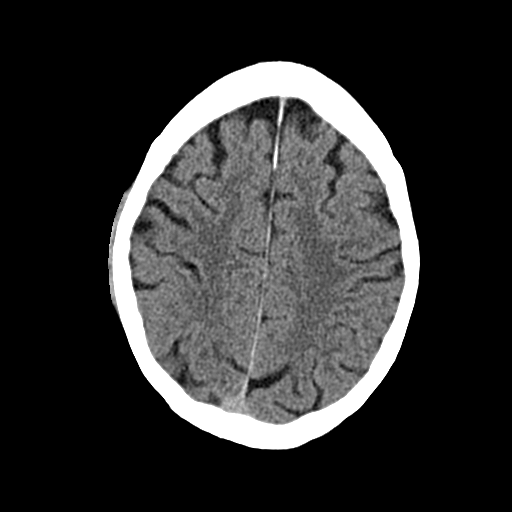
[im 22/30  brain]
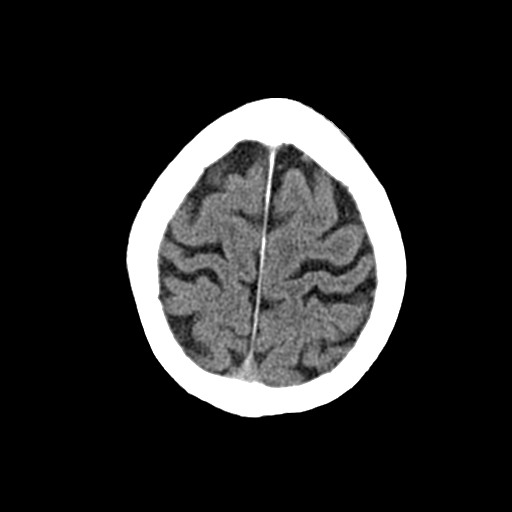
[im 25/30  brain]
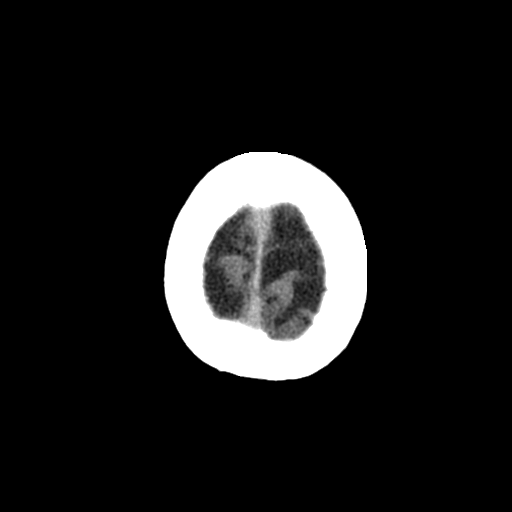
[im 28/30  brain]
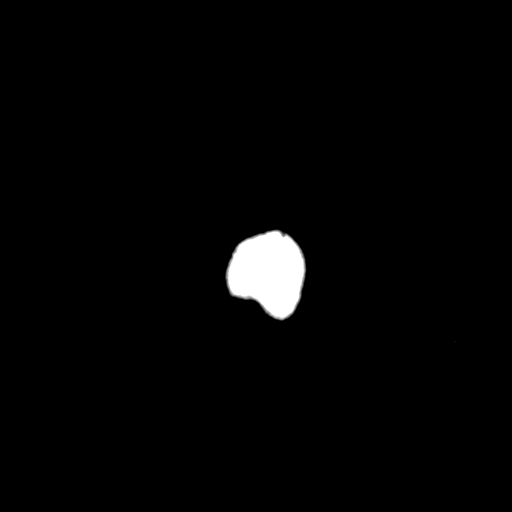
[im 28/30  bone]
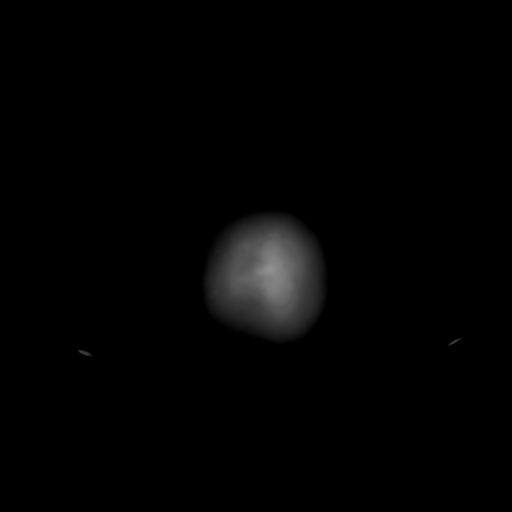

[Series 4: cor soft · coronal · 0.28mm/px · 3 of 66 slices shown]
[im 22/66  brain]
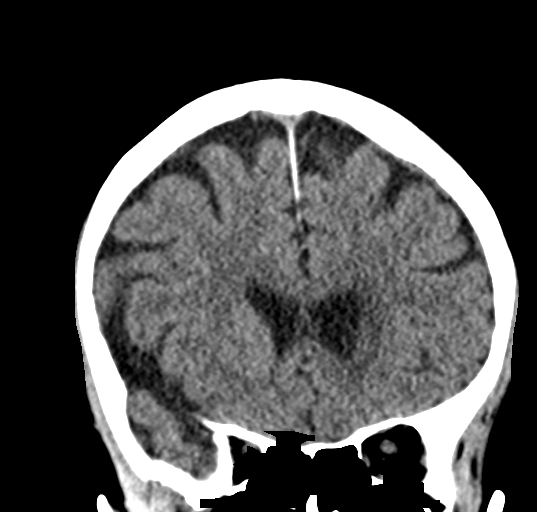
[im 29/66  brain]
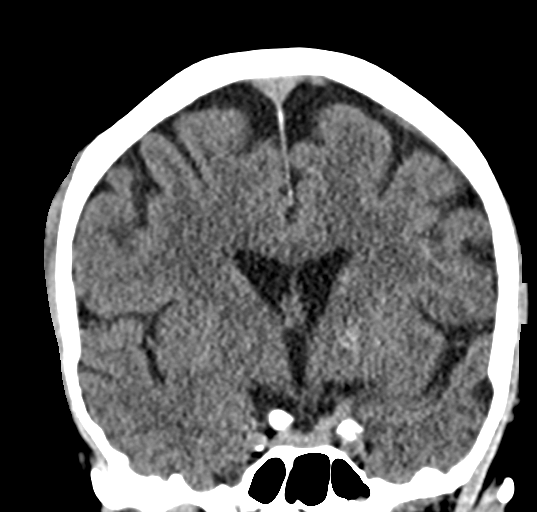
[im 37/66  brain]
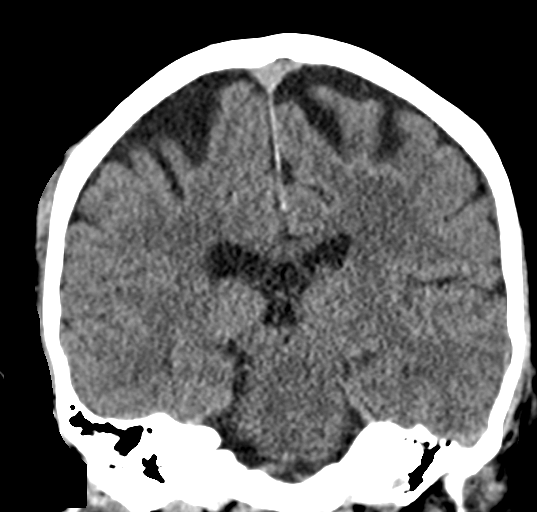

[Series 5: sag soft · sagittal · 0.28mm/px · 3 of 55 slices shown]
[im 19/55  brain]
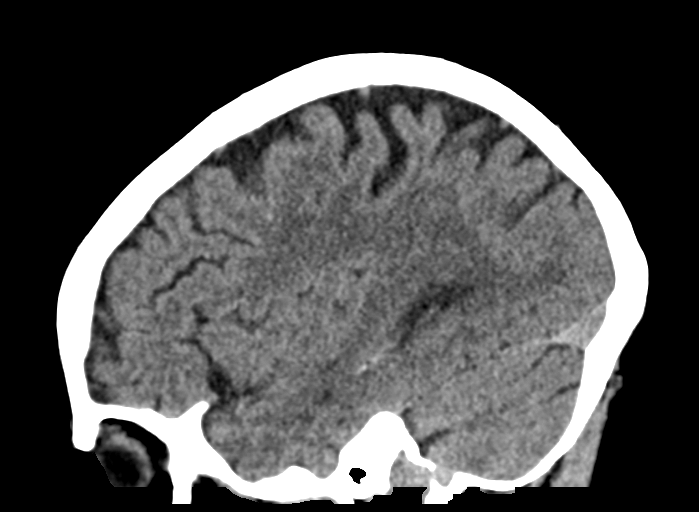
[im 28/55  brain]
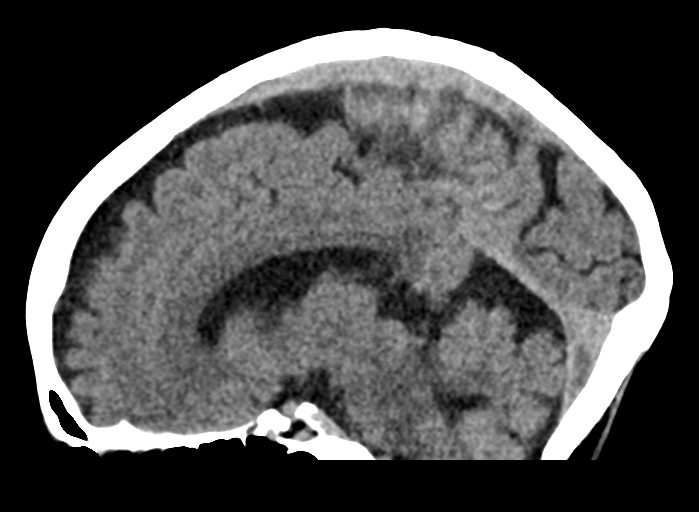
[im 37/55  brain]
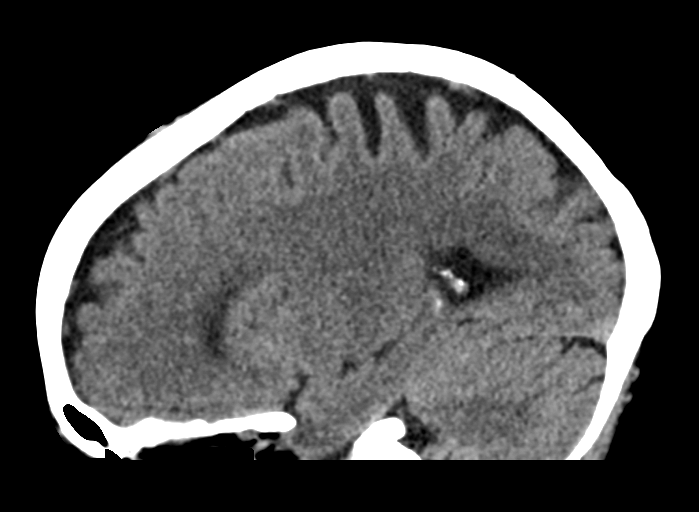

[15 of 47 positions shown; findings below may reference images not displayed]

FINDINGS: Brain: Generalized atrophy. Normal ventricular morphology. No
midline shift or mass effect. Small vessel chronic ischemic changes
of deep cerebral white matter. No intracranial hemorrhage, mass
lesion, evidence of acute infarction, or extra-axial fluid
collection.

Vascular: No hyperdense vessels

Skull: Intact. Small RIGHT parietal scalp hematoma.

Sinuses/Orbits: Minimal fluid LEFT sphenoid sinus. Remaining
paranasal sinuses and mastoid air cells clear

Other: N/A
IMPRESSION: Atrophy with small vessel chronic ischemic changes of deep cerebral
white matter.

No acute intracranial abnormalities.

Small RIGHT parietal scalp hematoma.

## 2021-02-23 DIAGNOSIS — Z9181 History of falling: Secondary | ICD-10-CM | POA: Diagnosis not present

## 2021-02-23 DIAGNOSIS — E785 Hyperlipidemia, unspecified: Secondary | ICD-10-CM | POA: Diagnosis not present

## 2021-02-23 DIAGNOSIS — Z1331 Encounter for screening for depression: Secondary | ICD-10-CM | POA: Diagnosis not present

## 2021-02-23 DIAGNOSIS — Z0000 Encounter for general adult medical examination without abnormal findings: Secondary | ICD-10-CM | POA: Diagnosis not present

## 2021-02-23 DIAGNOSIS — Z139 Encounter for screening, unspecified: Secondary | ICD-10-CM | POA: Diagnosis not present

## 2021-03-17 DIAGNOSIS — H524 Presbyopia: Secondary | ICD-10-CM | POA: Diagnosis not present

## 2021-04-19 DIAGNOSIS — E785 Hyperlipidemia, unspecified: Secondary | ICD-10-CM | POA: Diagnosis not present

## 2021-04-19 DIAGNOSIS — E559 Vitamin D deficiency, unspecified: Secondary | ICD-10-CM | POA: Diagnosis not present

## 2021-04-19 DIAGNOSIS — K219 Gastro-esophageal reflux disease without esophagitis: Secondary | ICD-10-CM | POA: Diagnosis not present

## 2021-04-19 DIAGNOSIS — I10 Essential (primary) hypertension: Secondary | ICD-10-CM | POA: Diagnosis not present

## 2021-04-19 DIAGNOSIS — M8589 Other specified disorders of bone density and structure, multiple sites: Secondary | ICD-10-CM | POA: Diagnosis not present

## 2021-08-24 DIAGNOSIS — F439 Reaction to severe stress, unspecified: Secondary | ICD-10-CM | POA: Diagnosis not present

## 2021-08-24 DIAGNOSIS — I10 Essential (primary) hypertension: Secondary | ICD-10-CM | POA: Diagnosis not present

## 2021-09-24 DIAGNOSIS — F439 Reaction to severe stress, unspecified: Secondary | ICD-10-CM | POA: Diagnosis not present

## 2021-09-24 DIAGNOSIS — I10 Essential (primary) hypertension: Secondary | ICD-10-CM | POA: Diagnosis not present

## 2021-10-20 DIAGNOSIS — I10 Essential (primary) hypertension: Secondary | ICD-10-CM | POA: Diagnosis not present

## 2021-10-20 DIAGNOSIS — K219 Gastro-esophageal reflux disease without esophagitis: Secondary | ICD-10-CM | POA: Diagnosis not present

## 2021-10-20 DIAGNOSIS — Z1231 Encounter for screening mammogram for malignant neoplasm of breast: Secondary | ICD-10-CM | POA: Diagnosis not present

## 2021-10-20 DIAGNOSIS — E785 Hyperlipidemia, unspecified: Secondary | ICD-10-CM | POA: Diagnosis not present

## 2021-10-20 DIAGNOSIS — F439 Reaction to severe stress, unspecified: Secondary | ICD-10-CM | POA: Diagnosis not present

## 2021-10-20 DIAGNOSIS — M8589 Other specified disorders of bone density and structure, multiple sites: Secondary | ICD-10-CM | POA: Diagnosis not present

## 2021-10-20 DIAGNOSIS — E559 Vitamin D deficiency, unspecified: Secondary | ICD-10-CM | POA: Diagnosis not present

## 2021-10-20 DIAGNOSIS — Z6821 Body mass index (BMI) 21.0-21.9, adult: Secondary | ICD-10-CM | POA: Diagnosis not present

## 2021-12-16 DIAGNOSIS — M8589 Other specified disorders of bone density and structure, multiple sites: Secondary | ICD-10-CM | POA: Diagnosis not present

## 2021-12-16 DIAGNOSIS — Z1231 Encounter for screening mammogram for malignant neoplasm of breast: Secondary | ICD-10-CM | POA: Diagnosis not present

## 2022-02-08 DIAGNOSIS — R350 Frequency of micturition: Secondary | ICD-10-CM | POA: Diagnosis not present

## 2022-03-09 DIAGNOSIS — R3915 Urgency of urination: Secondary | ICD-10-CM | POA: Diagnosis not present

## 2022-03-09 DIAGNOSIS — R310 Gross hematuria: Secondary | ICD-10-CM | POA: Diagnosis not present

## 2022-03-09 DIAGNOSIS — R350 Frequency of micturition: Secondary | ICD-10-CM | POA: Diagnosis not present

## 2022-03-17 DIAGNOSIS — R310 Gross hematuria: Secondary | ICD-10-CM | POA: Diagnosis not present

## 2022-03-17 DIAGNOSIS — K5730 Diverticulosis of large intestine without perforation or abscess without bleeding: Secondary | ICD-10-CM | POA: Diagnosis not present

## 2022-03-17 DIAGNOSIS — N3289 Other specified disorders of bladder: Secondary | ICD-10-CM | POA: Diagnosis not present

## 2022-03-30 DIAGNOSIS — H524 Presbyopia: Secondary | ICD-10-CM | POA: Diagnosis not present

## 2022-04-01 DIAGNOSIS — R3915 Urgency of urination: Secondary | ICD-10-CM | POA: Diagnosis not present

## 2022-04-01 DIAGNOSIS — R310 Gross hematuria: Secondary | ICD-10-CM | POA: Diagnosis not present

## 2022-04-01 DIAGNOSIS — R350 Frequency of micturition: Secondary | ICD-10-CM | POA: Diagnosis not present

## 2022-04-01 DIAGNOSIS — R8279 Other abnormal findings on microbiological examination of urine: Secondary | ICD-10-CM | POA: Diagnosis not present

## 2022-04-19 DIAGNOSIS — E785 Hyperlipidemia, unspecified: Secondary | ICD-10-CM | POA: Diagnosis not present

## 2022-04-19 DIAGNOSIS — M8589 Other specified disorders of bone density and structure, multiple sites: Secondary | ICD-10-CM | POA: Diagnosis not present

## 2022-04-19 DIAGNOSIS — F439 Reaction to severe stress, unspecified: Secondary | ICD-10-CM | POA: Diagnosis not present

## 2022-04-19 DIAGNOSIS — I10 Essential (primary) hypertension: Secondary | ICD-10-CM | POA: Diagnosis not present

## 2022-04-19 DIAGNOSIS — E559 Vitamin D deficiency, unspecified: Secondary | ICD-10-CM | POA: Diagnosis not present

## 2022-04-19 DIAGNOSIS — K219 Gastro-esophageal reflux disease without esophagitis: Secondary | ICD-10-CM | POA: Diagnosis not present

## 2022-04-20 DIAGNOSIS — R7301 Impaired fasting glucose: Secondary | ICD-10-CM | POA: Diagnosis not present

## 2022-07-18 DIAGNOSIS — N3289 Other specified disorders of bladder: Secondary | ICD-10-CM | POA: Diagnosis not present

## 2022-07-18 DIAGNOSIS — N390 Urinary tract infection, site not specified: Secondary | ICD-10-CM | POA: Diagnosis not present

## 2022-09-09 DIAGNOSIS — N3001 Acute cystitis with hematuria: Secondary | ICD-10-CM | POA: Diagnosis not present

## 2022-10-20 DIAGNOSIS — R7301 Impaired fasting glucose: Secondary | ICD-10-CM | POA: Diagnosis not present

## 2022-10-20 DIAGNOSIS — Z1231 Encounter for screening mammogram for malignant neoplasm of breast: Secondary | ICD-10-CM | POA: Diagnosis not present

## 2022-10-20 DIAGNOSIS — E559 Vitamin D deficiency, unspecified: Secondary | ICD-10-CM | POA: Diagnosis not present

## 2022-10-20 DIAGNOSIS — E785 Hyperlipidemia, unspecified: Secondary | ICD-10-CM | POA: Diagnosis not present

## 2022-10-20 DIAGNOSIS — M8589 Other specified disorders of bone density and structure, multiple sites: Secondary | ICD-10-CM | POA: Diagnosis not present

## 2022-10-20 DIAGNOSIS — K219 Gastro-esophageal reflux disease without esophagitis: Secondary | ICD-10-CM | POA: Diagnosis not present

## 2022-10-20 DIAGNOSIS — I10 Essential (primary) hypertension: Secondary | ICD-10-CM | POA: Diagnosis not present

## 2022-12-19 DIAGNOSIS — Z1231 Encounter for screening mammogram for malignant neoplasm of breast: Secondary | ICD-10-CM | POA: Diagnosis not present

## 2022-12-21 DIAGNOSIS — M79642 Pain in left hand: Secondary | ICD-10-CM | POA: Diagnosis not present

## 2022-12-26 DIAGNOSIS — M79632 Pain in left forearm: Secondary | ICD-10-CM | POA: Diagnosis not present

## 2022-12-26 DIAGNOSIS — S62317A Displaced fracture of base of fifth metacarpal bone. left hand, initial encounter for closed fracture: Secondary | ICD-10-CM | POA: Diagnosis not present

## 2022-12-26 DIAGNOSIS — M25532 Pain in left wrist: Secondary | ICD-10-CM | POA: Diagnosis not present

## 2022-12-26 DIAGNOSIS — M79642 Pain in left hand: Secondary | ICD-10-CM | POA: Diagnosis not present

## 2022-12-28 DIAGNOSIS — S62332A Displaced fracture of neck of third metacarpal bone, right hand, initial encounter for closed fracture: Secondary | ICD-10-CM | POA: Diagnosis not present

## 2023-01-27 DIAGNOSIS — S62332A Displaced fracture of neck of third metacarpal bone, right hand, initial encounter for closed fracture: Secondary | ICD-10-CM | POA: Diagnosis not present

## 2023-04-07 DIAGNOSIS — H25813 Combined forms of age-related cataract, bilateral: Secondary | ICD-10-CM | POA: Diagnosis not present

## 2023-04-20 DIAGNOSIS — E559 Vitamin D deficiency, unspecified: Secondary | ICD-10-CM | POA: Diagnosis not present

## 2023-04-20 DIAGNOSIS — S62307K Unspecified fracture of fifth metacarpal bone, left hand, subsequent encounter for fracture with nonunion: Secondary | ICD-10-CM | POA: Diagnosis not present

## 2023-04-20 DIAGNOSIS — I10 Essential (primary) hypertension: Secondary | ICD-10-CM | POA: Diagnosis not present

## 2023-04-20 DIAGNOSIS — M8589 Other specified disorders of bone density and structure, multiple sites: Secondary | ICD-10-CM | POA: Diagnosis not present

## 2023-04-20 DIAGNOSIS — Z2989 Encounter for other specified prophylactic measures: Secondary | ICD-10-CM | POA: Diagnosis not present

## 2023-04-20 DIAGNOSIS — K219 Gastro-esophageal reflux disease without esophagitis: Secondary | ICD-10-CM | POA: Diagnosis not present

## 2023-04-20 DIAGNOSIS — R7301 Impaired fasting glucose: Secondary | ICD-10-CM | POA: Diagnosis not present

## 2023-04-20 DIAGNOSIS — E785 Hyperlipidemia, unspecified: Secondary | ICD-10-CM | POA: Diagnosis not present

## 2023-10-04 DIAGNOSIS — M9902 Segmental and somatic dysfunction of thoracic region: Secondary | ICD-10-CM | POA: Diagnosis not present

## 2023-10-04 DIAGNOSIS — M546 Pain in thoracic spine: Secondary | ICD-10-CM | POA: Diagnosis not present

## 2023-10-04 DIAGNOSIS — M9901 Segmental and somatic dysfunction of cervical region: Secondary | ICD-10-CM | POA: Diagnosis not present

## 2023-10-04 DIAGNOSIS — M50323 Other cervical disc degeneration at C6-C7 level: Secondary | ICD-10-CM | POA: Diagnosis not present

## 2023-10-05 DIAGNOSIS — M9902 Segmental and somatic dysfunction of thoracic region: Secondary | ICD-10-CM | POA: Diagnosis not present

## 2023-10-05 DIAGNOSIS — M546 Pain in thoracic spine: Secondary | ICD-10-CM | POA: Diagnosis not present

## 2023-10-05 DIAGNOSIS — M9901 Segmental and somatic dysfunction of cervical region: Secondary | ICD-10-CM | POA: Diagnosis not present

## 2023-10-05 DIAGNOSIS — M50323 Other cervical disc degeneration at C6-C7 level: Secondary | ICD-10-CM | POA: Diagnosis not present

## 2023-10-11 DIAGNOSIS — M9902 Segmental and somatic dysfunction of thoracic region: Secondary | ICD-10-CM | POA: Diagnosis not present

## 2023-10-11 DIAGNOSIS — M546 Pain in thoracic spine: Secondary | ICD-10-CM | POA: Diagnosis not present

## 2023-10-11 DIAGNOSIS — M50323 Other cervical disc degeneration at C6-C7 level: Secondary | ICD-10-CM | POA: Diagnosis not present

## 2023-10-11 DIAGNOSIS — M9901 Segmental and somatic dysfunction of cervical region: Secondary | ICD-10-CM | POA: Diagnosis not present

## 2023-10-18 DIAGNOSIS — M9901 Segmental and somatic dysfunction of cervical region: Secondary | ICD-10-CM | POA: Diagnosis not present

## 2023-10-18 DIAGNOSIS — M9902 Segmental and somatic dysfunction of thoracic region: Secondary | ICD-10-CM | POA: Diagnosis not present

## 2023-10-18 DIAGNOSIS — M546 Pain in thoracic spine: Secondary | ICD-10-CM | POA: Diagnosis not present

## 2023-10-18 DIAGNOSIS — M50323 Other cervical disc degeneration at C6-C7 level: Secondary | ICD-10-CM | POA: Diagnosis not present

## 2023-10-23 DIAGNOSIS — I10 Essential (primary) hypertension: Secondary | ICD-10-CM | POA: Diagnosis not present

## 2023-10-23 DIAGNOSIS — G720 Drug-induced myopathy: Secondary | ICD-10-CM | POA: Diagnosis not present

## 2023-10-23 DIAGNOSIS — K219 Gastro-esophageal reflux disease without esophagitis: Secondary | ICD-10-CM | POA: Diagnosis not present

## 2023-10-23 DIAGNOSIS — R7301 Impaired fasting glucose: Secondary | ICD-10-CM | POA: Diagnosis not present

## 2023-10-23 DIAGNOSIS — R519 Headache, unspecified: Secondary | ICD-10-CM | POA: Diagnosis not present

## 2023-10-23 DIAGNOSIS — T466X5A Adverse effect of antihyperlipidemic and antiarteriosclerotic drugs, initial encounter: Secondary | ICD-10-CM | POA: Diagnosis not present

## 2023-10-23 DIAGNOSIS — E785 Hyperlipidemia, unspecified: Secondary | ICD-10-CM | POA: Diagnosis not present

## 2023-10-23 DIAGNOSIS — M8589 Other specified disorders of bone density and structure, multiple sites: Secondary | ICD-10-CM | POA: Diagnosis not present

## 2023-10-23 DIAGNOSIS — E559 Vitamin D deficiency, unspecified: Secondary | ICD-10-CM | POA: Diagnosis not present

## 2023-10-24 DIAGNOSIS — M546 Pain in thoracic spine: Secondary | ICD-10-CM | POA: Diagnosis not present

## 2023-10-24 DIAGNOSIS — M9901 Segmental and somatic dysfunction of cervical region: Secondary | ICD-10-CM | POA: Diagnosis not present

## 2023-10-24 DIAGNOSIS — M9902 Segmental and somatic dysfunction of thoracic region: Secondary | ICD-10-CM | POA: Diagnosis not present

## 2023-10-24 DIAGNOSIS — M50323 Other cervical disc degeneration at C6-C7 level: Secondary | ICD-10-CM | POA: Diagnosis not present

## 2023-11-17 DIAGNOSIS — M9902 Segmental and somatic dysfunction of thoracic region: Secondary | ICD-10-CM | POA: Diagnosis not present

## 2023-11-17 DIAGNOSIS — M9901 Segmental and somatic dysfunction of cervical region: Secondary | ICD-10-CM | POA: Diagnosis not present

## 2023-11-17 DIAGNOSIS — M546 Pain in thoracic spine: Secondary | ICD-10-CM | POA: Diagnosis not present

## 2023-11-17 DIAGNOSIS — M50323 Other cervical disc degeneration at C6-C7 level: Secondary | ICD-10-CM | POA: Diagnosis not present

## 2023-12-13 DIAGNOSIS — M50323 Other cervical disc degeneration at C6-C7 level: Secondary | ICD-10-CM | POA: Diagnosis not present

## 2023-12-13 DIAGNOSIS — M9901 Segmental and somatic dysfunction of cervical region: Secondary | ICD-10-CM | POA: Diagnosis not present

## 2023-12-13 DIAGNOSIS — M9902 Segmental and somatic dysfunction of thoracic region: Secondary | ICD-10-CM | POA: Diagnosis not present

## 2023-12-13 DIAGNOSIS — M546 Pain in thoracic spine: Secondary | ICD-10-CM | POA: Diagnosis not present

## 2023-12-25 DIAGNOSIS — Z803 Family history of malignant neoplasm of breast: Secondary | ICD-10-CM | POA: Diagnosis not present

## 2023-12-25 DIAGNOSIS — Z1231 Encounter for screening mammogram for malignant neoplasm of breast: Secondary | ICD-10-CM | POA: Diagnosis not present

## 2023-12-28 DIAGNOSIS — Z13820 Encounter for screening for osteoporosis: Secondary | ICD-10-CM | POA: Diagnosis not present

## 2023-12-28 DIAGNOSIS — Z780 Asymptomatic menopausal state: Secondary | ICD-10-CM | POA: Diagnosis not present

## 2023-12-28 DIAGNOSIS — M8589 Other specified disorders of bone density and structure, multiple sites: Secondary | ICD-10-CM | POA: Diagnosis not present

## 2024-01-10 DIAGNOSIS — M9902 Segmental and somatic dysfunction of thoracic region: Secondary | ICD-10-CM | POA: Diagnosis not present

## 2024-01-10 DIAGNOSIS — M9901 Segmental and somatic dysfunction of cervical region: Secondary | ICD-10-CM | POA: Diagnosis not present

## 2024-01-10 DIAGNOSIS — M50323 Other cervical disc degeneration at C6-C7 level: Secondary | ICD-10-CM | POA: Diagnosis not present

## 2024-01-10 DIAGNOSIS — M546 Pain in thoracic spine: Secondary | ICD-10-CM | POA: Diagnosis not present

## 2024-02-07 DIAGNOSIS — M9902 Segmental and somatic dysfunction of thoracic region: Secondary | ICD-10-CM | POA: Diagnosis not present

## 2024-02-07 DIAGNOSIS — M50323 Other cervical disc degeneration at C6-C7 level: Secondary | ICD-10-CM | POA: Diagnosis not present

## 2024-02-07 DIAGNOSIS — M9901 Segmental and somatic dysfunction of cervical region: Secondary | ICD-10-CM | POA: Diagnosis not present

## 2024-02-07 DIAGNOSIS — M546 Pain in thoracic spine: Secondary | ICD-10-CM | POA: Diagnosis not present

## 2024-03-13 DIAGNOSIS — M50323 Other cervical disc degeneration at C6-C7 level: Secondary | ICD-10-CM | POA: Diagnosis not present

## 2024-03-13 DIAGNOSIS — M546 Pain in thoracic spine: Secondary | ICD-10-CM | POA: Diagnosis not present

## 2024-03-13 DIAGNOSIS — M9902 Segmental and somatic dysfunction of thoracic region: Secondary | ICD-10-CM | POA: Diagnosis not present

## 2024-03-13 DIAGNOSIS — M9901 Segmental and somatic dysfunction of cervical region: Secondary | ICD-10-CM | POA: Diagnosis not present

## 2024-04-12 DIAGNOSIS — H25813 Combined forms of age-related cataract, bilateral: Secondary | ICD-10-CM | POA: Diagnosis not present

## 2024-04-22 DIAGNOSIS — K219 Gastro-esophageal reflux disease without esophagitis: Secondary | ICD-10-CM | POA: Diagnosis not present

## 2024-04-22 DIAGNOSIS — T466X5A Adverse effect of antihyperlipidemic and antiarteriosclerotic drugs, initial encounter: Secondary | ICD-10-CM | POA: Diagnosis not present

## 2024-04-22 DIAGNOSIS — I10 Essential (primary) hypertension: Secondary | ICD-10-CM | POA: Diagnosis not present

## 2024-04-22 DIAGNOSIS — R7301 Impaired fasting glucose: Secondary | ICD-10-CM | POA: Diagnosis not present

## 2024-04-22 DIAGNOSIS — E559 Vitamin D deficiency, unspecified: Secondary | ICD-10-CM | POA: Diagnosis not present

## 2024-04-22 DIAGNOSIS — G720 Drug-induced myopathy: Secondary | ICD-10-CM | POA: Diagnosis not present

## 2024-04-22 DIAGNOSIS — Z6823 Body mass index (BMI) 23.0-23.9, adult: Secondary | ICD-10-CM | POA: Diagnosis not present

## 2024-04-22 DIAGNOSIS — E785 Hyperlipidemia, unspecified: Secondary | ICD-10-CM | POA: Diagnosis not present

## 2024-04-22 DIAGNOSIS — M8589 Other specified disorders of bone density and structure, multiple sites: Secondary | ICD-10-CM | POA: Diagnosis not present

## 2024-04-22 DIAGNOSIS — F439 Reaction to severe stress, unspecified: Secondary | ICD-10-CM | POA: Diagnosis not present

## 2024-04-26 DIAGNOSIS — B309 Viral conjunctivitis, unspecified: Secondary | ICD-10-CM | POA: Diagnosis not present

## 2024-05-29 DIAGNOSIS — M9901 Segmental and somatic dysfunction of cervical region: Secondary | ICD-10-CM | POA: Diagnosis not present

## 2024-05-29 DIAGNOSIS — M546 Pain in thoracic spine: Secondary | ICD-10-CM | POA: Diagnosis not present

## 2024-05-29 DIAGNOSIS — M9902 Segmental and somatic dysfunction of thoracic region: Secondary | ICD-10-CM | POA: Diagnosis not present

## 2024-05-29 DIAGNOSIS — M50323 Other cervical disc degeneration at C6-C7 level: Secondary | ICD-10-CM | POA: Diagnosis not present

## 2024-06-11 DIAGNOSIS — Z01818 Encounter for other preprocedural examination: Secondary | ICD-10-CM | POA: Diagnosis not present

## 2024-06-11 DIAGNOSIS — H2512 Age-related nuclear cataract, left eye: Secondary | ICD-10-CM | POA: Diagnosis not present

## 2024-06-11 DIAGNOSIS — H2513 Age-related nuclear cataract, bilateral: Secondary | ICD-10-CM | POA: Diagnosis not present

## 2024-06-18 DIAGNOSIS — K5909 Other constipation: Secondary | ICD-10-CM | POA: Diagnosis not present

## 2024-06-18 DIAGNOSIS — Z79899 Other long term (current) drug therapy: Secondary | ICD-10-CM | POA: Diagnosis not present

## 2024-06-18 DIAGNOSIS — H259 Unspecified age-related cataract: Secondary | ICD-10-CM | POA: Diagnosis not present

## 2024-06-18 DIAGNOSIS — K219 Gastro-esophageal reflux disease without esophagitis: Secondary | ICD-10-CM | POA: Diagnosis not present

## 2024-06-18 DIAGNOSIS — M1712 Unilateral primary osteoarthritis, left knee: Secondary | ICD-10-CM | POA: Diagnosis not present

## 2024-06-18 DIAGNOSIS — E7800 Pure hypercholesterolemia, unspecified: Secondary | ICD-10-CM | POA: Diagnosis not present

## 2024-06-18 DIAGNOSIS — I10 Essential (primary) hypertension: Secondary | ICD-10-CM | POA: Diagnosis not present

## 2024-06-18 DIAGNOSIS — H2512 Age-related nuclear cataract, left eye: Secondary | ICD-10-CM | POA: Diagnosis not present

## 2024-06-18 DIAGNOSIS — H35363 Drusen (degenerative) of macula, bilateral: Secondary | ICD-10-CM | POA: Diagnosis not present

## 2024-06-27 DIAGNOSIS — M50323 Other cervical disc degeneration at C6-C7 level: Secondary | ICD-10-CM | POA: Diagnosis not present

## 2024-06-27 DIAGNOSIS — M9902 Segmental and somatic dysfunction of thoracic region: Secondary | ICD-10-CM | POA: Diagnosis not present

## 2024-06-27 DIAGNOSIS — M546 Pain in thoracic spine: Secondary | ICD-10-CM | POA: Diagnosis not present

## 2024-06-27 DIAGNOSIS — M9901 Segmental and somatic dysfunction of cervical region: Secondary | ICD-10-CM | POA: Diagnosis not present

## 2024-07-25 DIAGNOSIS — M9902 Segmental and somatic dysfunction of thoracic region: Secondary | ICD-10-CM | POA: Diagnosis not present

## 2024-07-25 DIAGNOSIS — M50323 Other cervical disc degeneration at C6-C7 level: Secondary | ICD-10-CM | POA: Diagnosis not present

## 2024-07-25 DIAGNOSIS — M546 Pain in thoracic spine: Secondary | ICD-10-CM | POA: Diagnosis not present

## 2024-07-25 DIAGNOSIS — M9901 Segmental and somatic dysfunction of cervical region: Secondary | ICD-10-CM | POA: Diagnosis not present

## 2024-08-20 DIAGNOSIS — M50323 Other cervical disc degeneration at C6-C7 level: Secondary | ICD-10-CM | POA: Diagnosis not present

## 2024-08-20 DIAGNOSIS — M546 Pain in thoracic spine: Secondary | ICD-10-CM | POA: Diagnosis not present

## 2024-08-20 DIAGNOSIS — M9902 Segmental and somatic dysfunction of thoracic region: Secondary | ICD-10-CM | POA: Diagnosis not present

## 2024-08-20 DIAGNOSIS — M9901 Segmental and somatic dysfunction of cervical region: Secondary | ICD-10-CM | POA: Diagnosis not present
